# Patient Record
Sex: Female | Born: 1955 | Race: Black or African American | Hispanic: No | Marital: Married | Smoking: Never smoker
Health system: Southern US, Community
[De-identification: ages and names within clinical notes are randomized; demographics above are authoritative.]

## PROBLEM LIST (undated history)

## (undated) DIAGNOSIS — Z800 Family history of malignant neoplasm of digestive organs: Secondary | ICD-10-CM

## (undated) DIAGNOSIS — K769 Liver disease, unspecified: Secondary | ICD-10-CM

## (undated) DIAGNOSIS — E0500 Thyrotoxicosis with diffuse goiter without thyrotoxic crisis or storm: Secondary | ICD-10-CM

## (undated) DIAGNOSIS — I10 Essential (primary) hypertension: Secondary | ICD-10-CM

## (undated) DIAGNOSIS — Z86718 Personal history of other venous thrombosis and embolism: Secondary | ICD-10-CM

## (undated) DIAGNOSIS — Z5189 Encounter for other specified aftercare: Secondary | ICD-10-CM

## (undated) DIAGNOSIS — I639 Cerebral infarction, unspecified: Secondary | ICD-10-CM

## (undated) DIAGNOSIS — M1990 Unspecified osteoarthritis, unspecified site: Secondary | ICD-10-CM

## (undated) DIAGNOSIS — D649 Anemia, unspecified: Secondary | ICD-10-CM

## (undated) DIAGNOSIS — Z87442 Personal history of urinary calculi: Secondary | ICD-10-CM

## (undated) DIAGNOSIS — F32A Depression, unspecified: Secondary | ICD-10-CM

## (undated) DIAGNOSIS — K760 Fatty (change of) liver, not elsewhere classified: Secondary | ICD-10-CM

## (undated) DIAGNOSIS — I2699 Other pulmonary embolism without acute cor pulmonale: Secondary | ICD-10-CM

## (undated) DIAGNOSIS — K219 Gastro-esophageal reflux disease without esophagitis: Secondary | ICD-10-CM

## (undated) DIAGNOSIS — R7303 Prediabetes: Secondary | ICD-10-CM

## (undated) DIAGNOSIS — J189 Pneumonia, unspecified organism: Secondary | ICD-10-CM

## (undated) DIAGNOSIS — E785 Hyperlipidemia, unspecified: Secondary | ICD-10-CM

## (undated) DIAGNOSIS — T7840XA Allergy, unspecified, initial encounter: Secondary | ICD-10-CM

## (undated) DIAGNOSIS — Z8739 Personal history of other diseases of the musculoskeletal system and connective tissue: Secondary | ICD-10-CM

## (undated) DIAGNOSIS — G4730 Sleep apnea, unspecified: Secondary | ICD-10-CM

## (undated) HISTORY — DX: Depression, unspecified: F32.A

## (undated) HISTORY — DX: Essential (primary) hypertension: I10

## (undated) HISTORY — PX: OTHER SURGICAL HISTORY: SHX169

## (undated) HISTORY — DX: Personal history of other diseases of the musculoskeletal system and connective tissue: Z87.39

## (undated) HISTORY — DX: Sleep apnea, unspecified: G47.30

## (undated) HISTORY — DX: Encounter for other specified aftercare: Z51.89

## (undated) HISTORY — DX: Thyrotoxicosis with diffuse goiter without thyrotoxic crisis or storm: E05.00

## (undated) HISTORY — DX: Gastro-esophageal reflux disease without esophagitis: K21.9

## (undated) HISTORY — DX: Personal history of other venous thrombosis and embolism: Z86.718

## (undated) HISTORY — PX: ABDOMINAL HYSTERECTOMY: SUR658

## (undated) HISTORY — DX: Unspecified osteoarthritis, unspecified site: M19.90

## (undated) HISTORY — PX: CARPAL TUNNEL RELEASE: SHX101

## (undated) HISTORY — DX: Family history of malignant neoplasm of digestive organs: Z80.0

## (undated) HISTORY — PX: KNEE ARTHROSCOPY: SUR90

## (undated) HISTORY — DX: Other pulmonary embolism without acute cor pulmonale: I26.99

## (undated) HISTORY — DX: Cerebral infarction, unspecified: I63.9

## (undated) HISTORY — DX: Hyperlipidemia, unspecified: E78.5

## (undated) HISTORY — PX: TOTAL HIP ARTHROPLASTY: SHX124

## (undated) HISTORY — PX: BREAST SURGERY: SHX581

## (undated) HISTORY — PX: FOOT SURGERY: SHX648

## (undated) HISTORY — DX: Anemia, unspecified: D64.9

## (undated) HISTORY — DX: Liver disease, unspecified: K76.9

## (undated) HISTORY — PX: COLONOSCOPY: SHX174

## (undated) HISTORY — DX: Allergy, unspecified, initial encounter: T78.40XA

## (undated) MED FILL — Iron Sucrose Inj 20 MG/ML (Fe Equiv): INTRAVENOUS | Qty: 15 | Status: AC

---

## 2013-03-12 DIAGNOSIS — M25569 Pain in unspecified knee: Secondary | ICD-10-CM

## 2013-03-12 DIAGNOSIS — M545 Low back pain, unspecified: Secondary | ICD-10-CM

## 2013-03-12 HISTORY — DX: Pain in unspecified knee: M25.569

## 2013-03-12 HISTORY — DX: Low back pain, unspecified: M54.50

## 2014-08-09 HISTORY — PX: COLONOSCOPY: SHX174

## 2015-04-13 DIAGNOSIS — R55 Syncope and collapse: Secondary | ICD-10-CM

## 2015-04-13 DIAGNOSIS — I480 Paroxysmal atrial fibrillation: Secondary | ICD-10-CM

## 2015-04-13 HISTORY — DX: Syncope and collapse: R55

## 2015-04-13 HISTORY — DX: Paroxysmal atrial fibrillation: I48.0

## 2016-02-16 DIAGNOSIS — S39012A Strain of muscle, fascia and tendon of lower back, initial encounter: Secondary | ICD-10-CM | POA: Insufficient documentation

## 2016-02-16 HISTORY — DX: Strain of muscle, fascia and tendon of lower back, initial encounter: S39.012A

## 2016-05-04 DIAGNOSIS — E785 Hyperlipidemia, unspecified: Secondary | ICD-10-CM

## 2016-05-04 DIAGNOSIS — I161 Hypertensive emergency: Secondary | ICD-10-CM | POA: Diagnosis not present

## 2016-05-04 DIAGNOSIS — G459 Transient cerebral ischemic attack, unspecified: Secondary | ICD-10-CM | POA: Diagnosis not present

## 2016-05-04 DIAGNOSIS — G4733 Obstructive sleep apnea (adult) (pediatric): Secondary | ICD-10-CM | POA: Diagnosis not present

## 2016-05-04 DIAGNOSIS — D649 Anemia, unspecified: Secondary | ICD-10-CM

## 2016-05-05 DIAGNOSIS — I161 Hypertensive emergency: Secondary | ICD-10-CM | POA: Diagnosis not present

## 2016-05-05 DIAGNOSIS — G459 Transient cerebral ischemic attack, unspecified: Secondary | ICD-10-CM | POA: Diagnosis not present

## 2016-05-05 DIAGNOSIS — G4733 Obstructive sleep apnea (adult) (pediatric): Secondary | ICD-10-CM | POA: Diagnosis not present

## 2016-05-05 DIAGNOSIS — D649 Anemia, unspecified: Secondary | ICD-10-CM

## 2016-05-05 DIAGNOSIS — E785 Hyperlipidemia, unspecified: Secondary | ICD-10-CM

## 2017-06-21 HISTORY — PX: OTHER SURGICAL HISTORY: SHX169

## 2017-07-12 DIAGNOSIS — M7751 Other enthesopathy of right foot: Secondary | ICD-10-CM | POA: Insufficient documentation

## 2017-07-12 DIAGNOSIS — G8929 Other chronic pain: Secondary | ICD-10-CM | POA: Insufficient documentation

## 2017-07-12 DIAGNOSIS — I872 Venous insufficiency (chronic) (peripheral): Secondary | ICD-10-CM | POA: Insufficient documentation

## 2017-07-12 HISTORY — DX: Other enthesopathy of right foot and ankle: M77.51

## 2017-07-12 HISTORY — DX: Other chronic pain: G89.29

## 2017-07-12 HISTORY — DX: Venous insufficiency (chronic) (peripheral): I87.2

## 2017-09-08 ENCOUNTER — Encounter: Payer: Self-pay | Admitting: Gastroenterology

## 2017-11-03 DIAGNOSIS — K529 Noninfective gastroenteritis and colitis, unspecified: Secondary | ICD-10-CM

## 2017-11-03 DIAGNOSIS — E785 Hyperlipidemia, unspecified: Secondary | ICD-10-CM

## 2017-11-03 DIAGNOSIS — I10 Essential (primary) hypertension: Secondary | ICD-10-CM

## 2017-11-03 DIAGNOSIS — G4733 Obstructive sleep apnea (adult) (pediatric): Secondary | ICD-10-CM | POA: Diagnosis not present

## 2017-11-03 DIAGNOSIS — N201 Calculus of ureter: Secondary | ICD-10-CM | POA: Diagnosis not present

## 2017-11-04 DIAGNOSIS — G4733 Obstructive sleep apnea (adult) (pediatric): Secondary | ICD-10-CM | POA: Diagnosis not present

## 2017-11-04 DIAGNOSIS — E785 Hyperlipidemia, unspecified: Secondary | ICD-10-CM | POA: Diagnosis not present

## 2017-11-04 DIAGNOSIS — N201 Calculus of ureter: Secondary | ICD-10-CM | POA: Diagnosis not present

## 2017-11-04 DIAGNOSIS — I10 Essential (primary) hypertension: Secondary | ICD-10-CM | POA: Diagnosis not present

## 2017-11-04 DIAGNOSIS — K529 Noninfective gastroenteritis and colitis, unspecified: Secondary | ICD-10-CM | POA: Diagnosis not present

## 2017-11-05 DIAGNOSIS — N201 Calculus of ureter: Secondary | ICD-10-CM | POA: Diagnosis not present

## 2017-11-05 DIAGNOSIS — E785 Hyperlipidemia, unspecified: Secondary | ICD-10-CM | POA: Diagnosis not present

## 2017-11-05 DIAGNOSIS — K529 Noninfective gastroenteritis and colitis, unspecified: Secondary | ICD-10-CM | POA: Diagnosis not present

## 2017-11-05 DIAGNOSIS — I10 Essential (primary) hypertension: Secondary | ICD-10-CM | POA: Diagnosis not present

## 2017-11-15 DIAGNOSIS — Z4889 Encounter for other specified surgical aftercare: Secondary | ICD-10-CM

## 2017-11-15 HISTORY — DX: Encounter for other specified surgical aftercare: Z48.89

## 2018-04-19 ENCOUNTER — Encounter: Payer: Self-pay | Admitting: Gastroenterology

## 2018-05-02 ENCOUNTER — Other Ambulatory Visit: Payer: Self-pay

## 2018-05-02 DIAGNOSIS — I872 Venous insufficiency (chronic) (peripheral): Secondary | ICD-10-CM

## 2018-05-31 ENCOUNTER — Encounter: Payer: BLUE CROSS/BLUE SHIELD | Admitting: Gastroenterology

## 2018-06-05 DIAGNOSIS — M169 Osteoarthritis of hip, unspecified: Secondary | ICD-10-CM | POA: Insufficient documentation

## 2018-06-05 DIAGNOSIS — M1611 Unilateral primary osteoarthritis, right hip: Secondary | ICD-10-CM

## 2018-06-05 HISTORY — DX: Osteoarthritis of hip, unspecified: M16.9

## 2018-06-05 HISTORY — DX: Unilateral primary osteoarthritis, right hip: M16.11

## 2018-06-23 ENCOUNTER — Encounter

## 2018-06-23 ENCOUNTER — Encounter (HOSPITAL_COMMUNITY): Payer: BLUE CROSS/BLUE SHIELD

## 2018-06-23 ENCOUNTER — Encounter: Payer: Self-pay | Admitting: Vascular Surgery

## 2018-06-23 ENCOUNTER — Encounter: Payer: BLUE CROSS/BLUE SHIELD | Admitting: Vascular Surgery

## 2018-06-27 ENCOUNTER — Encounter: Payer: Self-pay | Admitting: Surgery

## 2018-07-12 ENCOUNTER — Encounter: Payer: Self-pay | Admitting: Family Medicine

## 2018-11-24 DIAGNOSIS — M316 Other giant cell arteritis: Secondary | ICD-10-CM | POA: Insufficient documentation

## 2018-11-24 DIAGNOSIS — E66813 Obesity, class 3: Secondary | ICD-10-CM

## 2018-11-24 HISTORY — DX: Other giant cell arteritis: M31.6

## 2018-11-24 HISTORY — DX: Morbid (severe) obesity due to excess calories: E66.01

## 2018-11-24 HISTORY — DX: Obesity, class 3: E66.813

## 2018-12-20 ENCOUNTER — Encounter: Payer: Self-pay | Admitting: Gastroenterology

## 2019-01-26 ENCOUNTER — Other Ambulatory Visit: Payer: Self-pay

## 2019-01-26 ENCOUNTER — Ambulatory Visit: Payer: BC Managed Care – PPO | Admitting: *Deleted

## 2019-01-26 VITALS — Ht 64.0 in | Wt 260.0 lb

## 2019-01-26 DIAGNOSIS — Z7952 Long term (current) use of systemic steroids: Secondary | ICD-10-CM | POA: Insufficient documentation

## 2019-01-26 DIAGNOSIS — Z800 Family history of malignant neoplasm of digestive organs: Secondary | ICD-10-CM

## 2019-01-26 DIAGNOSIS — G4489 Other headache syndrome: Secondary | ICD-10-CM | POA: Insufficient documentation

## 2019-01-26 HISTORY — DX: Long term (current) use of systemic steroids: Z79.52

## 2019-01-26 HISTORY — DX: Other headache syndrome: G44.89

## 2019-01-26 NOTE — Progress Notes (Signed)
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 Pt mailed instruction packet to included paper to complete and mail back to [facility] with addressed and stamped envelope, Emmi video, copy of consent form to read and not return, and instructions. PV completed over the phone. Pt encouraged to call with questions or issues   Pt is aware that care partner will wait in the car during procedure; if they feel like they will be too hot to wait in the car; they may wait in the lobby.  We want them to wear a mask (we do not have any that we can provide them), practice social distancing, and we will check their temperatures when they get here.  I did remind patient that their care partner needs to stay in the parking lot the entire time. Pt will wear mask into building.

## 2019-02-01 ENCOUNTER — Encounter: Payer: Self-pay | Admitting: Gastroenterology

## 2019-02-05 ENCOUNTER — Telehealth: Payer: Self-pay | Admitting: Gastroenterology

## 2019-02-05 NOTE — Telephone Encounter (Signed)
Pt would like to know whether she can continue aspirin.

## 2019-02-05 NOTE — Telephone Encounter (Signed)
Called patient, no answer. Left message on voicemail that the patient take her aspirin as directed. We do not stop this medication before colonoscopy.

## 2019-02-08 ENCOUNTER — Telehealth: Payer: Self-pay

## 2019-02-08 NOTE — Telephone Encounter (Signed)
Covid-19 screening questions   Do you now or have you had a fever in the last 14 days?  Do you have any respiratory symptoms of shortness of breath or cough now or in the last 14 days?  Do you have any family members or close contacts with diagnosed or suspected Covid-19 in the past 14 days?  Have you been tested for Covid-19 and found to be positive?    Left message to c/b   

## 2019-02-09 ENCOUNTER — Ambulatory Visit (AMBULATORY_SURGERY_CENTER): Payer: BC Managed Care – PPO | Admitting: Gastroenterology

## 2019-02-09 ENCOUNTER — Other Ambulatory Visit: Payer: Self-pay

## 2019-02-09 ENCOUNTER — Encounter: Payer: Self-pay | Admitting: Gastroenterology

## 2019-02-09 VITALS — BP 127/74 | HR 68 | Temp 96.6°F | Resp 18 | Ht 64.0 in | Wt 260.0 lb

## 2019-02-09 DIAGNOSIS — Z1211 Encounter for screening for malignant neoplasm of colon: Secondary | ICD-10-CM | POA: Diagnosis not present

## 2019-02-09 DIAGNOSIS — Z800 Family history of malignant neoplasm of digestive organs: Secondary | ICD-10-CM | POA: Diagnosis not present

## 2019-02-09 DIAGNOSIS — D123 Benign neoplasm of transverse colon: Secondary | ICD-10-CM

## 2019-02-09 MED ORDER — SODIUM CHLORIDE 0.9 % IV SOLN: 500.0000 mL | Freq: Once | INTRAVENOUS | Status: AC

## 2019-02-09 NOTE — Progress Notes (Signed)
Pt's states no medical or surgical changes since previsit or office visit.  [place]

## 2019-02-09 NOTE — Op Note (Signed)
[place] Patient Name: Stefanie Campbell Procedure Date: 02/09/2019 8:29 AM MRN: [id number] Endoscopist: Jackquline Denmark , MD Age: 63 Referring MD:  Date of Birth: 1956-04-23 Gender: Female Account #: [id number] Procedure:                Colonoscopy Indications:              FH colon cancer -dad at age 93. Medicines:                Monitored Anesthesia Care Procedure:                Pre-Anesthesia Assessment:                           - Prior to the procedure, a History and Physical                            was performed, and patient medications and                            allergies were reviewed. The patient's tolerance of                            previous anesthesia was also reviewed. The risks                            and benefits of the procedure and the sedation                            options and risks were discussed with the patient.                            All questions were answered, and informed consent                            was obtained. Prior Anticoagulants: The patient has                            taken no previous anticoagulant or antiplatelet                            agents. ASA Grade Assessment: III - A patient with                            severe systemic disease. After reviewing the risks                            and benefits, the patient was deemed in                            satisfactory condition to undergo the procedure.                           After obtaining informed consent, the colonoscope  was passed under direct vision. Throughout the                            procedure, the patient's blood pressure, pulse, and                            oxygen saturations were monitored continuously. The                            Colonoscope was introduced through the anus and                            advanced to the the cecum, identified by                            appendiceal orifice and ileocecal  valve. The                            colonoscopy was performed without difficulty. The                            patient tolerated the procedure well. The quality                            of the bowel preparation was good. The ileocecal                            valve, appendiceal orifice, and rectum were                            photographed. Scope In: 8:37:36 AM Scope Out: 8:59:31 AM Scope Withdrawal Time: 0 hours 15 minutes 13 seconds  Total Procedure Duration: 0 hours 21 minutes 55 seconds  Findings:                 A 4 mm polyp was found in the hepatic flexure. The                            polyp was sessile. The polyp was removed with a                            cold biopsy forceps. Resection and retrieval were                            complete. Estimated blood loss: none.                           A few rare small-mouthed diverticula were found in                            the sigmoid colon and ascending colon.                           Non-bleeding internal hemorrhoids were found during  retroflexion. The hemorrhoids were small.                           The exam was otherwise without abnormality. Complications:            No immediate complications. Estimated Blood Loss:     Estimated blood loss: none. Impression:               -Diminutive colonic polyp S/P polypectomy                           -Mild colonic diverticulosis.                           -Otherwise normal colonoscopy. Recommendation:           - Patient has a contact number available for                            emergencies. The signs and symptoms of potential                            delayed complications were discussed with the                            patient. Return to normal activities tomorrow.                            Written discharge instructions were provided to the                            patient.                           - Resume previous diet.                            - Continue present medications.                           - Await pathology results.                           - Repeat colonoscopy in 5 years for screening                            purposes. Earlier, if with any new problems or if                            there is any change in family history.                           - Return to GI clinic PRN. Jackquline Denmark, MD 02/09/2019 9:04:38 AM This report has been signed electronically.

## 2019-02-09 NOTE — Progress Notes (Signed)
Called to room to assist during endoscopic procedure.  Patient ID and intended procedure confirmed with present staff. Received instructions for my participation in the procedure from the performing physician.  

## 2019-02-09 NOTE — Progress Notes (Signed)
A and O x3. Report to RN. Tolerated MAC anesthesia well.

## 2019-02-09 NOTE — Patient Instructions (Signed)
YOU HAD AN ENDOSCOPIC PROCEDURE TODAY AT [facility]:   Refer to the procedure report that was given to you for any specific questions about what was found during the examination.  If the procedure report does not answer your questions, please call your gastroenterologist to clarify.  If you requested that your care partner not be given the details of your procedure findings, then the procedure report has been included in a sealed envelope for you to review at your convenience later.  YOU SHOULD EXPECT: Some feelings of bloating in the abdomen. Passage of more gas than usual.  Walking can help get rid of the air that was put into your GI tract during the procedure and reduce the bloating. If you had a lower endoscopy (such as a colonoscopy or flexible sigmoidoscopy) you may notice spotting of blood in your stool or on the toilet paper. If you underwent a bowel prep for your procedure, you may not have a normal bowel movement for a few days.  Please Note:  You might notice some irritation and congestion in your nose or some drainage.  This is from the oxygen used during your procedure.  There is no need for concern and it should clear up in a day or so.  SYMPTOMS TO REPORT IMMEDIATELY:   Following lower endoscopy (colonoscopy or flexible sigmoidoscopy):  Excessive amounts of blood in the stool  Significant tenderness or worsening of abdominal pains  Swelling of the abdomen that is new, acute  Fever of 100F or higher   For urgent or emergent issues, a gastroenterologist can be reached at any hour by calling [phone].   DIET:  We do recommend a small meal at first, but then you may proceed to your regular diet.  Drink plenty of fluids but you should avoid alcoholic beverages for 24 hours.  MEDICATIONS: Continue present medications.  Return to Dr. Steve Rattler office as needed.  Please see handouts given to you by your recovery nurse.  ACTIVITY:  You should plan to take it  easy for the rest of today and you should NOT DRIVE or use heavy machinery until tomorrow (because of the sedation medicines used during the test).    FOLLOW UP: Our staff will call the number listed on your records 48-72 hours following your procedure to check on you and address any questions or concerns that you may have regarding the information given to you following your procedure. If we do not reach you, we will leave a message.  We will attempt to reach you two times.  During this call, we will ask if you have developed any symptoms of COVID 19. If you develop any symptoms (ie: fever, flu-like symptoms, shortness of breath, cough etc.) before then, please call [phone].  If you test positive for Covid 19 in the 2 weeks post procedure, please call and report this information to [place].    If any biopsies were taken you will be contacted by phone or by letter within the next 1-3 weeks.  Please call us at [phone] if you have not heard about the biopsies in 3 weeks.   Thank you for allowing [place] to provide for your healthcare needs today.   SIGNATURES/CONFIDENTIALITY: You and/or your care partner have signed paperwork which will be entered into your electronic medical record.  These signatures attest to the fact that that the information above on your After Visit Summary has been reviewed and is understood.  Full responsibility of the confidentiality  of this discharge information lies with you and/or your care-partner.

## 2019-02-13 ENCOUNTER — Telehealth: Payer: Self-pay

## 2019-02-13 ENCOUNTER — Telehealth: Payer: Self-pay | Admitting: *Deleted

## 2019-02-13 NOTE — Telephone Encounter (Signed)
  Follow up Call-  Call back number 02/09/2019  Post procedure Call Back phone  # [phone]  Permission to leave phone message Yes  Some recent data might be hidden     Patient questions:  Do you have a fever, pain , or abdominal swelling? No. Pain Score  0 *  Have you tolerated food without any problems? Yes.    Have you been able to return to your normal activities? Yes.    Do you have any questions about your discharge instructions: Diet   No. Medications  No. Follow up visit  No.  Do you have questions or concerns about your Care? No.  Actions: * If pain score is 4 or above: No action needed, pain <4.  1. Have you developed a fever since your procedure? no  2.   Have you had an respiratory symptoms (SOB or cough) since your procedure? no  3.   Have you tested positive for COVID 19 since your procedure no  4.   Have you had any family members/close contacts diagnosed with the COVID 19 since your procedure?  no   If yes to any of these questions please route to Joylene John, RN and Alphonsa Gin, Therapist, sports.

## 2019-02-13 NOTE — Telephone Encounter (Signed)
Left message on follow up call. 

## 2019-02-18 ENCOUNTER — Encounter: Payer: Self-pay | Admitting: Gastroenterology

## 2019-03-29 ENCOUNTER — Other Ambulatory Visit: Payer: Self-pay | Admitting: Family Medicine

## 2019-03-29 DIAGNOSIS — N6320 Unspecified lump in the left breast, unspecified quadrant: Secondary | ICD-10-CM

## 2019-04-05 ENCOUNTER — Ambulatory Visit
Admission: RE | Admit: 2019-04-05 | Discharge: 2019-04-05 | Disposition: A | Discharge status: Home / Self Care | Payer: BC Managed Care – PPO | Source: Ambulatory Visit | Attending: Family Medicine | Admitting: Family Medicine

## 2019-04-05 ENCOUNTER — Other Ambulatory Visit: Payer: Self-pay

## 2019-04-05 DIAGNOSIS — N6320 Unspecified lump in the left breast, unspecified quadrant: Secondary | ICD-10-CM

## 2019-07-27 ENCOUNTER — Other Ambulatory Visit: Payer: Self-pay | Admitting: Vascular Surgery

## 2019-07-27 DIAGNOSIS — I872 Venous insufficiency (chronic) (peripheral): Secondary | ICD-10-CM

## 2019-10-05 ENCOUNTER — Encounter: Payer: Self-pay | Admitting: Cardiology

## 2019-10-05 ENCOUNTER — Ambulatory Visit: Payer: BC Managed Care – PPO | Admitting: Cardiology

## 2019-10-05 ENCOUNTER — Other Ambulatory Visit: Payer: Self-pay

## 2019-10-05 VITALS — BP 130/70 | HR 85 | Ht 62.0 in | Wt 286.0 lb

## 2019-10-05 DIAGNOSIS — R0600 Dyspnea, unspecified: Secondary | ICD-10-CM | POA: Diagnosis not present

## 2019-10-05 DIAGNOSIS — I10 Essential (primary) hypertension: Secondary | ICD-10-CM

## 2019-10-05 DIAGNOSIS — R002 Palpitations: Secondary | ICD-10-CM

## 2019-10-05 DIAGNOSIS — R079 Chest pain, unspecified: Secondary | ICD-10-CM | POA: Diagnosis not present

## 2019-10-05 DIAGNOSIS — Z8249 Family history of ischemic heart disease and other diseases of the circulatory system: Secondary | ICD-10-CM

## 2019-10-05 DIAGNOSIS — E782 Mixed hyperlipidemia: Secondary | ICD-10-CM

## 2019-10-05 LAB — BASIC METABOLIC PANEL
BUN/Creatinine Ratio: 24 (ref 12–28)
BUN: 25 mg/dL (ref 8–27)
CO2: 21 mmol/L (ref 20–29)
Calcium: 10.3 mg/dL (ref 8.7–10.3)
Chloride: 100 mmol/L (ref 96–106)
Creatinine, Ser: 1.04 mg/dL — ABNORMAL HIGH (ref 0.57–1.00)
GFR calc Af Amer: 66 mL/min/{1.73_m2} (ref >59)
GFR calc non Af Amer: 57 mL/min/{1.73_m2} — ABNORMAL LOW (ref >59)
Glucose: 130 mg/dL — ABNORMAL HIGH (ref 65–99)
Potassium: 4.2 mmol/L (ref 3.5–5.2)
Sodium: 140 mmol/L (ref 134–144)

## 2019-10-05 MED ORDER — POTASSIUM CHLORIDE CRYS ER 20 MEQ PO TBCR: 20.0000 meq | EXTENDED_RELEASE_TABLET | Freq: Every day | ORAL | 1 refills | Status: DC

## 2019-10-05 MED ORDER — FUROSEMIDE 40 MG PO TABS: 40.0000 mg | ORAL_TABLET | Freq: Every day | ORAL | 1 refills | Status: DC

## 2019-10-05 NOTE — Progress Notes (Signed)
Cardiology Office Note:    Date:  10/06/2019   ID:  Stefanie Campbell, DOB 12/04/1955, MRN [id number]  PCP:  Gala Lewandowsky, MD  Cardiologist:  Berniece Salines, DO  Electrophysiologist:  None   Referring MD: Gala Lewandowsky, MD   Chief Complaint  Patient presents with  . Chest Pain  The patient was   History of Present Illness:    Stefanie Campbell is a 64 y.o. 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  The patient tells me today that she has been experiencing intermittent chest pain, she described as a squeezing sensation the last for several minutes.  She denies associated shortness of breath.  Chest squeezing is more bothersome.  She admits to associated shortness of breath.  In addition she has been experiencing frequent intermittent palpitations.  She described as a fast heartbeat that lasts for several minutes.  Recently she told me that her PCP started her on metoprolol and this has pretty much resolved her palpitations.  No other complaints at this time.  Past Medical History:  Diagnosis Date  . Allergy   . Anemia   . Arthritis   . Blood transfusion without reported diagnosis    allergic and had to stop 15 minutes in   . GERD (gastroesophageal reflux disease)   . Graves disease   . Hyperlipidemia   . Hypertension   . Liver disease   . Sleep apnea    wears cpap   . Stroke [facility])    mini stroke per pt     Past Surgical History:  Procedure Laterality Date  . ABDOMINAL HYSTERECTOMY    . BREAST SURGERY     benign cyst  . CARPAL TUNNEL RELEASE Right   . CESAREAN SECTION     x2  . COLONOSCOPY    . FOOT SURGERY     on toe   . KNEE ARTHROSCOPY Left   . temperol biopsy    . TOTAL HIP ARTHROPLASTY Right     Current  Medications: Current Meds  Medication Sig  . albuterol (VENTOLIN HFA) 108 (90 Base) MCG/ACT inhaler albuterol sulfate HFA 90 mcg/actuation aerosol inhaler  TAKE 2 PUFFS BY MOUTH EVERY 4 TO 6 HOURS AS NEEDED  . alendronate (FOSAMAX) 70 MG tablet Fosamax 70 mg tablet  Take 1 tablet every week by oral route before meals.  Marland Kitchen amLODipine (NORVASC) 10 MG tablet amlodipine 10 mg tablet  TAKE 1 TABLET BY MOUTH EVERY DAY  . aspirin EC 81 MG tablet Take by mouth.  . Calcium Carbonate (CALCIUM 600 PO) Take by mouth.  . cetirizine (ZYRTEC) 10 MG tablet Take 10 mg by mouth daily.  . diclofenac Sodium (VOLTAREN) 1 % GEL Apply 2 g topically 4 (four) times daily.  Marland Kitchen lisinopril (ZESTRIL) 40 MG tablet Take 40 mg by mouth daily.  Marland Kitchen lovastatin (MEVACOR) 20 MG tablet lovastatin 20 mg tablet  TAKE 1 TABLET BY MOUTH EVERY DAY  . methocarbamol (ROBAXIN) 750 MG tablet Take 750 mg by mouth 3 (three) times daily.  . metoprolol succinate (TOPROL-XL) 50 MG 24 hr tablet Take 50 mg by mouth daily.  . pantoprazole (PROTONIX) 20 MG tablet Take 20 mg by mouth daily.  . predniSONE (DELTASONE) 5 MG tablet Take 15 mg by mouth daily with breakfast.  . Tocilizumab (ACTEMRA)  162 MG/0.9ML SOSY Actemra 162 mg/0.9 mL subcutaneous syringe  . Vitamin D, Ergocalciferol, (DRISDOL) 1.25 MG (50000 UT) CAPS capsule Take by mouth.  . [DISCONTINUED] cetirizine (ZYRTEC) 10 MG tablet Take 10 mg by mouth daily.   Current Facility-Administered Medications for the 10/05/19 encounter (Office Visit) with Berniece Salines, DO  Medication  . 0.9 %  sodium chloride infusion     Allergies:   Hydrocodone and Magnesium-containing compounds   Social History   Socioeconomic History  . Marital status: Married    Spouse name: Not on file  . Number of children: Not on file  . Years of education: Not on file  . Highest education level: Not on file  Occupational History  . Not on file  Tobacco Use  . Smoking status: Never Smoker  . Smokeless tobacco:  Never Used  Substance and Sexual Activity  . Alcohol use: Not Currently  . Drug use: Never  . Sexual activity: Not on file  Other Topics Concern  . Not on file  Social History Narrative  . Not on file   Social Determinants of Health   Financial Resource Strain:   . Difficulty of Paying Living Expenses:   Food Insecurity:   . Worried About Charity fundraiser in the Last Year:   . Arboriculturist in the Last Year:   Transportation Needs:   . Film/video editor (Medical):   Marland Kitchen Lack of Transportation (Non-Medical):   Physical Activity:   . Days of Exercise per Week:   . Minutes of Exercise per Session:   Stress:   . Feeling of Stress :   Social Connections:   . Frequency of Communication with Friends and Family:   . Frequency of Social Gatherings with Friends and Family:   . Attends Religious Services:   . Active Member of Clubs or Organizations:   . Attends Archivist Meetings:   Marland Kitchen Marital Status:      Family History: The patient's family history includes Breast cancer in her sister; Colon cancer in her father; Colon polyps in her sister; Heart attack in her sister and sister. There is no history of Esophageal cancer, Rectal cancer, or Stomach cancer.  ROS:   Review of Systems  Constitution: Negative for decreased appetite, fever and weight gain.  HENT: Negative for congestion, ear discharge, hoarse voice and sore throat.   Eyes: Negative for discharge, redness, vision loss in right eye and visual halos.  Cardiovascular: Reports chest pain, dyspnea on exertion and palpitations.  Negative for leg swelling, orthopnea and palpitations.  Respiratory: Negative for cough, hemoptysis, shortness of breath and snoring.   Endocrine: Negative for heat intolerance and polyphagia.  Hematologic/Lymphatic: Negative for bleeding problem. Does not bruise/bleed easily.  Skin: Negative for flushing, nail changes, rash and suspicious lesions.  Musculoskeletal: Negative for  arthritis, joint pain, muscle cramps, myalgias, neck pain and stiffness.  Gastrointestinal: Negative for abdominal pain, bowel incontinence, diarrhea and excessive appetite.  Genitourinary: Negative for decreased libido, genital sores and incomplete emptying.  Neurological: Negative for brief paralysis, focal weakness, headaches and loss of balance.  Psychiatric/Behavioral: Negative for altered mental status, depression and suicidal ideas.  Allergic/Immunologic: Negative for HIV exposure and persistent infections.    EKGs/Labs/Other Studies Reviewed:    The following studies were reviewed today:   EKG:  The ekg ordered today demonstrates sinus rhythm, heart rate 85 bpm, left axis deviation with, nonspecific intraventricular conduction defect.  Recent Labs: Lab work done on September 17, 2019  reports Chemistry: Glucose 98, BUN 15, creatinine 0.93, sodium 143, potassium 4.6, chloride 104, bicarb 19, calcium 10.9, total protein 7.1, albumin 4.6, total globulin 2.5, alkaline phosphatase 74, AST 39, ALT 73 troponin less than 0.01 CBC: WBC 10.3, hemoglobin 11.3, hematocrit 34.4, platelet 189  Recent Lipid Panel Lipid profile: Total cholesterol 202, HDL 57, triglycerides 218, LDL 111  Physical Exam:    VS:  BP 130/70 (BP Location: Left Arm, Patient Position: Sitting, Cuff Size: Large)   Pulse 85   Ht 5\' 2"  (1.575 m)   Wt 286 lb (129.7 kg)   SpO2 95%   BMI 52.31 kg/m     Wt Readings from Last 3 Encounters:  10/05/19 286 lb (129.7 kg)  09/17/19 287 lb (130.2 kg)  02/09/19 260 lb (117.9 kg)     GEN: Well nourished, well developed in no acute distress HEENT: Normal NECK: No JVD; No carotid bruits LYMPHATICS: No lymphadenopathy CARDIAC: S1S2 noted,RRR, no murmurs, rubs, gallops RESPIRATORY:  Clear to auscultation without rales, wheezing or rhonchi  ABDOMEN: Soft, non-tender, non-distended, +bowel sounds, no guarding. EXTREMITIES: t+ bilateral leg edema, No cyanosis, no  clubbing MUSCULOSKELETAL:  No deformity  SKIN: Warm and dry NEUROLOGIC:  Alert and oriented x 3, non-focal PSYCHIATRIC:  Normal affect, good insight   ASSESSMENT:    1. Dyspnea, unspecified type   2. Chest pain, unspecified type   3. Essential hypertension   4. Palpitations   5. Mixed hyperlipidemia   6. Morbid obesity ([place])   7. Family history of premature CAD    PLAN:     Her shortness of breath is multifactorial in the setting of her bilateral leg edema as well as her significant morbid obesity and deconditioning.  For now I am going to start the patient on Lasix 40 mg daily with potassium supplements.  In addition we will get a transthoracic echocardiogram to assess RV/LV function and any other structural abnormalities.  In terms of her chest pain she has multiple risk factors including significant family history of premature coronary artery disease.  I like to get a nuclear stress test for ischemic evaluation.  I have educated patient about this test and she is agreeable to proceed.  Palpitations-symptoms seem to have improved on metoprolol we will continue to monitor the patient.  Hypertension-blood pressure is acceptable in the office today.  Hyperlipidemia-continue lovastatin.  Morbid obesity-the patient understands the need to lose weight with diet and exercise. We have discussed specific strategies for this.  Blood work will performed for BMP, Mag, BNP today   The patient is in agreement with the above plan. The patient left the office in stable condition.  The patient will follow up in 3 months.   Medication Adjustments/Labs and Tests Ordered: Current medicines are reviewed at length with the patient today.  Concerns regarding medicines are outlined above.  Orders Placed This Encounter  Procedures  . Magnesium  . Pro b natriuretic peptide (BNP)  . Basic metabolic panel  . Myocardial Perfusion Imaging  . EKG 12-Lead  . ECHOCARDIOGRAM COMPLETE   Meds ordered  this encounter  Medications  . furosemide (LASIX) 40 MG tablet    Sig: Take 1 tablet (40 mg total) by mouth daily.    Dispense:  90 tablet    Refill:  1  . potassium chloride SA (KLOR-CON M20) 20 MEQ tablet    Sig: Take 1 tablet (20 mEq total) by mouth daily.    Dispense:  90 tablet    Refill:  1    Patient Instructions  Medication Instructions:   START TAKING:  1.LASIX 40 MG ONCE A DAY    2. POTASSIUM 020 MEQ ONCE DAY    *If you need a refill on your cardiac medications before your next appointment, please call your pharmacy*   Lab Work: BMET MAG AND BNP TODAY   If you have labs (blood work) drawn today and your tests are completely normal, you will receive your results only by: Marland Kitchen MyChart Message (if you have MyChart) OR . A paper copy in the mail If you have any lab test that is abnormal or we need to change your treatment, we will call you to review the results.   Testing/Procedures: Your physician has requested that you have an echocardiogram. Echocardiography is a painless test that uses sound waves to create images of your heart. It provides your doctor with information about the size and shape of your heart and how well your heart's chambers and valves are working. This procedure takes approximately one hour. There are no restrictions for this procedure.  Your physician has requested that you have a lexiscan myoview. For further information please visit [email or web address]. Please follow instruction sheet, as given.   Follow-Up: At [facility], you and your health needs are our priority.  As part of our continuing mission to provide you with exceptional heart care, we have created designated Provider Care Teams.  These Care Teams include your primary Cardiologist (physician) and Advanced Practice Providers (APPs -  Physician Assistants and Nurse Practitioners) who all work together to provide you with the care you need, when you need it.  We recommend signing up for  the patient portal called "MyChart".  Sign up information is provided on this After Visit Summary.  MyChart is used to connect with patients for Virtual Visits (Telemedicine).  Patients are able to view lab/test results, encounter notes, upcoming appointments, etc.  Non-urgent messages can be sent to your provider as well.   To learn more about what you can do with MyChart, go to [email or web address].    Your next appointment:   1 month(s)  The format for your next appointment:   In Person  Provider:   You will see  Dr. Harriet Masson.  Or, you can be scheduled with the following Advanced Practice Provider on your designated Care Team (at our [facility]):  Laurann Montana, FNP     Other Instructions      Adopting a Healthy Lifestyle.  Know what a healthy weight is for you (roughly BMI <25) and aim to maintain this   Aim for 7+ servings of fruits and vegetables daily   65-80+ fluid ounces of water or unsweet tea for healthy kidneys   Limit to max 1 drink of alcohol per day; avoid smoking/tobacco   Limit animal fats in diet for cholesterol and heart health - choose grass fed whenever available   Avoid highly processed foods, and foods high in saturated/trans fats   Aim for low stress - take time to unwind and care for your mental health   Aim for 150 min of moderate intensity exercise weekly for heart health, and weights twice weekly for bone health   Aim for 7-9 hours of sleep daily   When it comes to diets, agreement about the perfect plan isnt easy to find, even among the experts. Experts at the [employer] developed an idea known as the Healthy Eating Plate. Just imagine a plate divided into logical,  healthy portions.   The emphasis is on diet quality:   Load up on vegetables and fruits - one-half of your plate: Aim for color and variety, and remember that potatoes dont count.   Go for whole grains - one-quarter of your plate: Whole wheat,  barley, wheat berries, quinoa, oats, brown rice, and foods made with them. If you want pasta, go with whole wheat pasta.   Protein power - one-quarter of your plate: Fish, chicken, beans, and nuts are all healthy, versatile protein sources. Limit red meat.   The diet, however, does go beyond the plate, offering a few other suggestions.   Use healthy plant oils, such as olive, canola, soy, corn, sunflower and peanut. Check the labels, and avoid partially hydrogenated oil, which have unhealthy trans fats.   If youre thirsty, drink water. Coffee and tea are good in moderation, but skip sugary drinks and limit milk and dairy products to one or two daily servings.   The type of carbohydrate in the diet is more important than the amount. Some sources of carbohydrates, such as vegetables, fruits, whole grains, and beans-are healthier than others.   Finally, stay active  Signed, Berniece Salines, DO  10/06/2019 10:11 AM

## 2019-10-05 NOTE — Patient Instructions (Addendum)
Medication Instructions:   START TAKING:  1.LASIX 40 MG ONCE A DAY    2. POTASSIUM 020 MEQ ONCE DAY    *If you need a refill on your cardiac medications before your next appointment, please call your pharmacy*   Lab Work: BMET MAG AND BNP TODAY   If you have labs (blood work) drawn today and your tests are completely normal, you will receive your results only by: Marland Kitchen MyChart Message (if you have MyChart) OR . A paper copy in the mail If you have any lab test that is abnormal or we need to change your treatment, we will call you to review the results.   Testing/Procedures: Your physician has requested that you have an echocardiogram. Echocardiography is a painless test that uses sound waves to create images of your heart. It provides your doctor with information about the size and shape of your heart and how well your heart's chambers and valves are working. This procedure takes approximately one hour. There are no restrictions for this procedure.  Your physician has requested that you have a lexiscan myoview. For further information please visit [email or web address]. Please follow instruction sheet, as given.   Follow-Up: At [facility], you and your health needs are our priority.  As part of our continuing mission to provide you with exceptional heart care, we have created designated Provider Care Teams.  These Care Teams include your primary Cardiologist (physician) and Advanced Practice Providers (APPs -  Physician Assistants and Nurse Practitioners) who all work together to provide you with the care you need, when you need it.  We recommend signing up for the patient portal called "MyChart".  Sign up information is provided on this After Visit Summary.  MyChart is used to connect with patients for Virtual Visits (Telemedicine).  Patients are able to view lab/test results, encounter notes, upcoming appointments, etc.  Non-urgent messages can be sent to your provider as well.   To  learn more about what you can do with MyChart, go to [email or web address].    Your next appointment:   1 month(s)  The format for your next appointment:   In Person  Provider:   You will see  Dr. Harriet Masson.  Or, you can be scheduled with the following Advanced Practice Provider on your designated Care Team (at our [facility]):  Laurann Montana, FNP     Other Instructions

## 2019-10-06 ENCOUNTER — Encounter: Payer: Self-pay | Admitting: Cardiology

## 2019-10-06 DIAGNOSIS — R002 Palpitations: Secondary | ICD-10-CM

## 2019-10-06 DIAGNOSIS — R079 Chest pain, unspecified: Secondary | ICD-10-CM | POA: Insufficient documentation

## 2019-10-06 DIAGNOSIS — I10 Essential (primary) hypertension: Secondary | ICD-10-CM

## 2019-10-06 DIAGNOSIS — E782 Mixed hyperlipidemia: Secondary | ICD-10-CM | POA: Insufficient documentation

## 2019-10-06 HISTORY — DX: Essential (primary) hypertension: I10

## 2019-10-06 HISTORY — DX: Palpitations: R00.2

## 2019-10-06 HISTORY — DX: Chest pain, unspecified: R07.9

## 2019-10-06 HISTORY — DX: Mixed hyperlipidemia: E78.2

## 2019-10-06 LAB — PRO B NATRIURETIC PEPTIDE: NT-Pro BNP: 26 pg/mL (ref 0–287)

## 2019-10-06 LAB — MAGNESIUM: Magnesium: 1.8 mg/dL (ref 1.6–2.3)

## 2019-10-08 ENCOUNTER — Telehealth: Payer: Self-pay

## 2019-10-08 NOTE — Telephone Encounter (Signed)
-----   Message from Berniece Salines, DO sent at 10/08/2019  8:20 AM EDT ----- Creatinine slightly elevated, compared to labs from PCP. We will continue to monitor.

## 2019-10-08 NOTE — Telephone Encounter (Signed)
Spoke with patient regarding results and recommendation.  Patient verbalizes understanding and is agreeable to plan of care. Advised patient to call back with any issues or concerns.  

## 2019-10-17 ENCOUNTER — Telehealth (HOSPITAL_COMMUNITY): Payer: Self-pay | Admitting: *Deleted

## 2019-10-17 NOTE — Telephone Encounter (Signed)
Left message on voicemail per DPR in reference to upcoming appointment scheduled on 10/24/19 at 11:30 with detailed instructions given per Myocardial Perfusion Study Information Sheet for the test. LM to arrive 15 minutes early, and that it is imperative to arrive on time for appointment to keep from having the test rescheduled. If you need to cancel or reschedule your appointment, please call the office within 24 hours of your appointment. Failure to do so may result in a cancellation of your appointment, and a $50 no show fee. Phone number given for call back for any questions.

## 2019-10-24 ENCOUNTER — Other Ambulatory Visit: Payer: Self-pay

## 2019-10-24 ENCOUNTER — Other Ambulatory Visit: Payer: BC Managed Care – PPO

## 2019-10-24 ENCOUNTER — Ambulatory Visit: Payer: BC Managed Care – PPO

## 2019-10-25 ENCOUNTER — Ambulatory Visit (INDEPENDENT_AMBULATORY_CARE_PROVIDER_SITE_OTHER): Payer: BC Managed Care – PPO

## 2019-10-25 ENCOUNTER — Ambulatory Visit: Payer: BC Managed Care – PPO

## 2019-10-25 DIAGNOSIS — R079 Chest pain, unspecified: Secondary | ICD-10-CM | POA: Diagnosis not present

## 2019-10-26 ENCOUNTER — Other Ambulatory Visit: Payer: Self-pay

## 2019-10-26 ENCOUNTER — Ambulatory Visit (HOSPITAL_COMMUNITY): Payer: BC Managed Care – PPO | Attending: Internal Medicine

## 2019-10-26 ENCOUNTER — Telehealth: Payer: Self-pay

## 2019-10-26 VITALS — Ht 62.0 in | Wt 286.0 lb

## 2019-10-26 DIAGNOSIS — R0600 Dyspnea, unspecified: Secondary | ICD-10-CM | POA: Insufficient documentation

## 2019-10-26 DIAGNOSIS — R079 Chest pain, unspecified: Secondary | ICD-10-CM | POA: Insufficient documentation

## 2019-10-26 DIAGNOSIS — E782 Mixed hyperlipidemia: Secondary | ICD-10-CM | POA: Insufficient documentation

## 2019-10-26 MED ORDER — TECHNETIUM TC 99M TETROFOSMIN IV KIT
31.7000 | PACK | Freq: Once | INTRAVENOUS | Status: AC | PRN
  Administered 2019-10-26: 31.7 via INTRAVENOUS
  Filled 2019-10-26: qty 32

## 2019-10-26 MED ORDER — REGADENOSON 0.4 MG/5ML IV SOLN
0.4000 mg | Freq: Once | INTRAVENOUS | Status: AC
  Administered 2019-10-26: 0.4 mg via INTRAVENOUS

## 2019-10-26 NOTE — Telephone Encounter (Signed)
-----   Message from Berniece Salines, DO sent at 10/26/2019  2:09 PM EDT ----- The echo showed that the heart is not fully relaxing like it should ( diastolic dysfunction) , unfortunately we cannot see all the valves well but there are no regurgitation/leaking valves noted.  I will discuss it at the next office visit.

## 2019-10-26 NOTE — Telephone Encounter (Signed)
Tried calling patient. No answer and voicemail is full so I can not leave any messages at this time.  

## 2019-10-29 ENCOUNTER — Ambulatory Visit (HOSPITAL_COMMUNITY): Payer: BC Managed Care – PPO | Attending: Cardiovascular Disease

## 2019-10-29 ENCOUNTER — Other Ambulatory Visit: Payer: Self-pay

## 2019-10-29 MED ORDER — TECHNETIUM TC 99M TETROFOSMIN IV KIT
30.4000 | PACK | Freq: Once | INTRAVENOUS | Status: AC | PRN
  Administered 2019-10-29: 30.4 via INTRAVENOUS
  Filled 2019-10-29: qty 31

## 2019-10-29 NOTE — Telephone Encounter (Signed)
Spoke with patient regarding results and recommendation.  Patient verbalizes understanding and is agreeable to plan of care. Advised patient to call back with any issues or concerns.  

## 2019-10-30 LAB — MYOCARDIAL PERFUSION IMAGING
LV dias vol: 115 mL (ref 46–106)
LV sys vol: 50 mL
Peak HR: 111 {beats}/min
Rest HR: 83 {beats}/min
SDS: 4
SRS: 1
SSS: 5
TID: 0.97

## 2019-11-02 ENCOUNTER — Telehealth: Payer: Self-pay

## 2019-11-02 NOTE — Telephone Encounter (Signed)
-----   Message from Berniece Salines, DO sent at 11/02/2019  8:40 AM EDT ----- Normal stress test..

## 2019-11-02 NOTE — Telephone Encounter (Signed)
Spoke with patient regarding results.  Patient verbalizes understanding and is agreeable to plan of care. Advised patient to call back with any issues or concerns.  

## 2019-11-15 ENCOUNTER — Ambulatory Visit: Payer: BC Managed Care – PPO | Admitting: Cardiology

## 2019-11-15 ENCOUNTER — Other Ambulatory Visit: Payer: Self-pay

## 2019-11-15 ENCOUNTER — Encounter: Payer: Self-pay | Admitting: Cardiology

## 2019-11-15 VITALS — BP 138/78 | HR 92 | Ht 62.0 in | Wt 291.8 lb

## 2019-11-15 DIAGNOSIS — R600 Localized edema: Secondary | ICD-10-CM

## 2019-11-15 DIAGNOSIS — I10 Essential (primary) hypertension: Secondary | ICD-10-CM

## 2019-11-15 DIAGNOSIS — E782 Mixed hyperlipidemia: Secondary | ICD-10-CM | POA: Diagnosis not present

## 2019-11-15 HISTORY — DX: Localized edema: R60.0

## 2019-11-15 NOTE — Progress Notes (Signed)
Cardiology Office Note:    Date:  11/15/2019   ID:  Stefanie Campbell, DOB 21-Nov-1955, MRN [id number]  PCP:  Gala Lewandowsky, MD  Cardiologist:  Berniece Salines, DO  Electrophysiologist:  None   Referring MD: Gala Lewandowsky, MD   Chief Complaint  Patient presents with  . Follow-up    1 MO FU    History of Present Illness:    Stefanie Campbell is a 64 y.o. 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  I last saw the patient on 10/05/2019 at that time for her shortness of breath I recommended she undergo a pharmacologic stress test and an echocardiogram.   In the interim the patient underwent her testing. The results were previously called to the patient. Today she is her for a follow visit. She tells me that the she has had some resolution of her symptoms. No other complaints at this time.   Past Medical History:  Diagnosis Date  . Allergy   . Anemia   . Arthritis   . Blood transfusion without reported diagnosis    allergic and had to stop 15 minutes in   . GERD (gastroesophageal reflux disease)   . Graves disease   . Hyperlipidemia   . Hypertension   . Liver disease   . Sleep apnea    wears cpap   . Stroke [facility])    mini stroke per pt     Past Surgical History:  Procedure Laterality Date  . ABDOMINAL HYSTERECTOMY    . BREAST SURGERY     benign cyst  . CARPAL TUNNEL RELEASE Right   . CESAREAN SECTION     x2  . COLONOSCOPY    . FOOT SURGERY     on toe   . KNEE ARTHROSCOPY Left   . temperol biopsy    . TOTAL HIP ARTHROPLASTY Right     Current Medications: Current Meds  Medication Sig  . albuterol (VENTOLIN HFA) 108 (90 Base) MCG/ACT inhaler albuterol sulfate HFA 90 mcg/actuation aerosol inhaler  TAKE 2 PUFFS BY MOUTH EVERY 4 TO 6 HOURS AS NEEDED  . alendronate (FOSAMAX) 70 MG tablet Fosamax 70 mg tablet  Take 1  tablet every week by oral route before meals.  Marland Kitchen amLODipine (NORVASC) 10 MG tablet amlodipine 10 mg tablet  TAKE 1 TABLET BY MOUTH EVERY DAY  . aspirin EC 81 MG tablet Take 81 mg by mouth daily.   . Calcium Carbonate (CALCIUM 600 PO) Take 1 tablet by mouth daily.   . cetirizine (ZYRTEC) 10 MG tablet Take 10 mg by mouth as needed.   . diclofenac Sodium (VOLTAREN) 1 % GEL Apply 2 g topically 4 (four) times daily.  . Fluocinolone Acetonide Scalp 0.01 % OIL 2 (two) times a week.   . furosemide (LASIX) 40 MG tablet Take 1 tablet (40 mg total) by mouth daily.  Marland Kitchen ketoconazole (NIZORAL) 2 % shampoo Apply 1 application topically once a week.   Marland Kitchen lisinopril (ZESTRIL) 40 MG tablet Take 40 mg by mouth daily.  Marland Kitchen lovastatin (MEVACOR) 20 MG tablet lovastatin 20 mg tablet  TAKE 1 TABLET BY MOUTH EVERY DAY  . metoprolol succinate (TOPROL-XL) 50 MG 24 hr tablet Take 50 mg by mouth daily.  . pantoprazole (PROTONIX) 20 MG tablet Take 20 mg by mouth daily.  . potassium chloride SA (KLOR-CON M20) 20 MEQ tablet Take 1 tablet (20 mEq  total) by mouth daily.  . predniSONE (DELTASONE) 10 MG tablet Take 10 mg by mouth daily.   . SYMBICORT 160-4.5 MCG/ACT inhaler Inhale 2 puffs into the lungs 2 (two) times daily.   . Tocilizumab (ACTEMRA) 162 MG/0.9ML SOSY once a week.   . Vitamin D, Ergocalciferol, (DRISDOL) 1.25 MG (50000 UT) CAPS capsule Take 50,000 Units by mouth daily.    Current Facility-Administered Medications for the 11/15/19 encounter (Office Visit) with Berniece Salines, DO  Medication  . 0.9 %  sodium chloride infusion     Allergies:   Hydrocodone and Magnesium-containing compounds   Social History   Socioeconomic History  . Marital status: Married    Spouse name: Not on file  . Number of children: Not on file  . Years of education: Not on file  . Highest education level: Not on file  Occupational History  . Not on file  Tobacco Use  . Smoking status: Never Smoker  . Smokeless tobacco: Never Used   Substance and Sexual Activity  . Alcohol use: Not Currently  . Drug use: Never  . Sexual activity: Not on file  Other Topics Concern  . Not on file  Social History Narrative  . Not on file   Social Determinants of Health   Financial Resource Strain:   . Difficulty of Paying Living Expenses:   Food Insecurity:   . Worried About Charity fundraiser in the Last Year:   . Arboriculturist in the Last Year:   Transportation Needs:   . Film/video editor (Medical):   Marland Kitchen Lack of Transportation (Non-Medical):   Physical Activity:   . Days of Exercise per Week:   . Minutes of Exercise per Session:   Stress:   . Feeling of Stress :   Social Connections:   . Frequency of Communication with Friends and Family:   . Frequency of Social Gatherings with Friends and Family:   . Attends Religious Services:   . Active Member of Clubs or Organizations:   . Attends Archivist Meetings:   Marland Kitchen Marital Status:      Family History: The patient's family history includes Breast cancer in her sister; Colon cancer in her father; Colon polyps in her sister; Heart attack in her sister and sister. There is no history of Esophageal cancer, Rectal cancer, or Stomach cancer.  ROS:   Review of Systems  Constitution: Negative for decreased appetite, fever and weight gain.  HENT: Negative for congestion, ear discharge, hoarse voice and sore throat.   Eyes: Negative for discharge, redness, vision loss in right eye and visual halos.  Cardiovascular: Negative for chest pain, dyspnea on exertion, leg swelling, orthopnea and palpitations.  Respiratory: Negative for cough, hemoptysis, shortness of breath and snoring.   Endocrine: Negative for heat intolerance and polyphagia.  Hematologic/Lymphatic: Negative for bleeding problem. Does not bruise/bleed easily.  Skin: Negative for flushing, nail changes, rash and suspicious lesions.  Musculoskeletal: Negative for arthritis, joint pain, muscle cramps,  myalgias, neck pain and stiffness.  Gastrointestinal: Negative for abdominal pain, bowel incontinence, diarrhea and excessive appetite.  Genitourinary: Negative for decreased libido, genital sores and incomplete emptying.  Neurological: Negative for brief paralysis, focal weakness, headaches and loss of balance.  Psychiatric/Behavioral: Negative for altered mental status, depression and suicidal ideas.  Allergic/Immunologic: Negative for HIV exposure and persistent infections.    EKGs/Labs/Other Studies Reviewed:    The following studies were reviewed today:   EKG:  None today   TTE Impression  10/25/2019 1. Left ventricular ejection fraction, by estimation, is 50 to 55%. The  left ventricle has low normal function on limited apical views only. Left ventricular endocardial border not optimally defined to evaluate regional wall motion. Left ventricular  diastolic parameters are consistent with Grade I diastolic dysfunction (impaired relaxation).  2. Right ventricular systolic function was not well visualized. The right ventricular size is not well visualized.  3. The mitral valve was not well visualized. No evidence of mitral valve regurgitation.  4. The aortic valve was not well visualized. Aortic valve regurgitation is not visualized. No aortic stenosis is present.  5. The inferior vena cava is normal in size with greater than 50% respiratory variability, suggesting right atrial pressure of 3 mmHg.   Pharmacologic stress test 10/25/2019  Nuclear stress EF: 57%. The left ventricular ejection fraction is normal (55-65%).  There was no ST segment deviation noted during stress.  Defect 1: There is a small defect of moderate severity present in the mid inferior, apical inferior and apex location. This is most c/w breast and diaphragmatic attenuation. The apex and apical inferior walls contract normally .  This is a low risk study. No evidence of ischemia or previous infarction  The  study is normal.  Recent Labs: 10/05/2019: BUN 25; Creatinine, Ser 1.04; Magnesium 1.8; NT-Pro BNP 26; Potassium 4.2; Sodium 140  Recent Lipid Panel No results found for: CHOL, TRIG, HDL, CHOLHDL, VLDL, LDLCALC, LDLDIRECT  Physical Exam:    VS:  BP 138/78   Pulse 92   Ht 5\' 2"  (1.575 m)   Wt 291 lb 12.8 oz (132.4 kg)   SpO2 93%   BMI 53.37 kg/m     Wt Readings from Last 3 Encounters:  11/15/19 291 lb 12.8 oz (132.4 kg)  10/26/19 286 lb (129.7 kg)  10/24/19 286 lb (129.7 kg)     GEN: Well nourished, well developed in no acute distress HEENT: Normal NECK: No JVD; No carotid bruits LYMPHATICS: No lymphadenopathy CARDIAC: S1S2 noted,RRR, no murmurs, rubs, gallops RESPIRATORY:  Clear to auscultation without rales, wheezing or rhonchi  ABDOMEN: Soft, non-tender, non-distended, +bowel sounds, no guarding. EXTREMITIES: bilateral leg edema, No cyanosis, no clubbing MUSCULOSKELETAL:  No deformity  SKIN: Warm and dry NEUROLOGIC:  Alert and oriented x 3, non-focal PSYCHIATRIC:  Normal affect, good insight  ASSESSMENT:    1. Essential hypertension   2. Bilateral leg edema   3. Mixed hyperlipidemia    PLAN:    1. Bilateral leg edema - I will increase her lasix to 40 mg twice daily for 1 week and then go back to Lasix 40mg  daily with potassium supplement. Bmp and mag will be done today.   2. I discuss with patient again about her testing. All of her questions were answered.   3. Her chest pain and shortness is resolved.   4. Hypertension her blood pressure is acceptable at this time.  The patient is in agreement with the above plan. The patient left the office in stable condition.  The patient will follow up in 6 months or sooner if needed.   Medication Adjustments/Labs and Tests Ordered: Current medicines are reviewed at length with the patient today.  Concerns regarding medicines are outlined above.  No orders of the defined types were placed in this encounter.  No orders  of the defined types were placed in this encounter.   There are no Patient Instructions on file for this visit.   Adopting a Healthy Lifestyle.  Know what a  healthy weight is for you (roughly BMI <25) and aim to maintain this   Aim for 7+ servings of fruits and vegetables daily   65-80+ fluid ounces of water or unsweet tea for healthy kidneys   Limit to max 1 drink of alcohol per day; avoid smoking/tobacco   Limit animal fats in diet for cholesterol and heart health - choose grass fed whenever available   Avoid highly processed foods, and foods high in saturated/trans fats   Aim for low stress - take time to unwind and care for your mental health   Aim for 150 min of moderate intensity exercise weekly for heart health, and weights twice weekly for bone health   Aim for 7-9 hours of sleep daily   When it comes to diets, agreement about the perfect plan isnt easy to find, even among the experts. Experts at the [employer] developed an idea known as the Healthy Eating Plate. Just imagine a plate divided into logical, healthy portions.   The emphasis is on diet quality:   Load up on vegetables and fruits - one-half of your plate: Aim for color and variety, and remember that potatoes dont count.   Go for whole grains - one-quarter of your plate: Whole wheat, barley, wheat berries, quinoa, oats, brown rice, and foods made with them. If you want pasta, go with whole wheat pasta.   Protein power - one-quarter of your plate: Fish, chicken, beans, and nuts are all healthy, versatile protein sources. Limit red meat.   The diet, however, does go beyond the plate, offering a few other suggestions.   Use healthy plant oils, such as olive, canola, soy, corn, sunflower and peanut. Check the labels, and avoid partially hydrogenated oil, which have unhealthy trans fats.   If youre thirsty, drink water. Coffee and tea are good in moderation, but skip sugary drinks and  limit milk and dairy products to one or two daily servings.   The type of carbohydrate in the diet is more important than the amount. Some sources of carbohydrates, such as vegetables, fruits, whole grains, and beans-are healthier than others.   Finally, stay active  Signed, Berniece Salines, DO  11/15/2019 9:17 PM    [facility]

## 2019-11-16 ENCOUNTER — Encounter: Payer: Self-pay | Admitting: Cardiology

## 2019-11-16 NOTE — Progress Notes (Signed)
Sent request through epic for PCP to send blood work results to DR. Tobb

## 2019-11-16 NOTE — Patient Instructions (Signed)
Medication Instructions:  1. Increase Lasix to 40 mg two times daily for 1 week 2. Then go back to Lasix 40 mg once daily with a potassium supplement  *If you need a refill on your cardiac medications before your next appointment, please call your pharmacy*   Lab Work: Your physician recommends that you have a BMP and Magnesium level, we will check with Dr. Spero Curb for labs done in her office  If you have labs (blood work) drawn today and your tests are completely normal, you will receive your results only by: Marland Kitchen MyChart Message (if you have MyChart) OR . A paper copy in the mail If you have any lab test that is abnormal or we need to change your treatment, we will call you to review the results.   Testing/Procedures: None   Follow-Up: At [facility], you and your health needs are our priority.  As part of our continuing mission to provide you with exceptional heart care, we have created designated Provider Care Teams.  These Care Teams include your primary Cardiologist (physician) and Advanced Practice Providers (APPs -  Physician Assistants and Nurse Practitioners) who all work together to provide you with the care you need, when you need it.  We recommend signing up for the patient portal called "MyChart".  Sign up information is provided on this After Visit Summary.  MyChart is used to connect with patients for Virtual Visits (Telemedicine).  Patients are able to view lab/test results, encounter notes, upcoming appointments, etc.  Non-urgent messages can be sent to your provider as well.   To learn more about what you can do with MyChart, go to [email or web address].    Your next appointment:  Follow up with DR. Tobb in 6 months, you will get a letter in the mail letting you know it is time to make that appointment.

## 2020-01-24 DIAGNOSIS — G4730 Sleep apnea, unspecified: Secondary | ICD-10-CM | POA: Insufficient documentation

## 2020-01-24 DIAGNOSIS — K769 Liver disease, unspecified: Secondary | ICD-10-CM | POA: Insufficient documentation

## 2020-01-24 DIAGNOSIS — Z5189 Encounter for other specified aftercare: Secondary | ICD-10-CM | POA: Insufficient documentation

## 2020-01-24 DIAGNOSIS — M1990 Unspecified osteoarthritis, unspecified site: Secondary | ICD-10-CM | POA: Insufficient documentation

## 2020-01-24 DIAGNOSIS — I639 Cerebral infarction, unspecified: Secondary | ICD-10-CM | POA: Insufficient documentation

## 2020-01-24 DIAGNOSIS — E785 Hyperlipidemia, unspecified: Secondary | ICD-10-CM | POA: Insufficient documentation

## 2020-01-24 DIAGNOSIS — E0500 Thyrotoxicosis with diffuse goiter without thyrotoxic crisis or storm: Secondary | ICD-10-CM | POA: Insufficient documentation

## 2020-01-24 DIAGNOSIS — D649 Anemia, unspecified: Secondary | ICD-10-CM | POA: Insufficient documentation

## 2020-01-24 DIAGNOSIS — K219 Gastro-esophageal reflux disease without esophagitis: Secondary | ICD-10-CM | POA: Insufficient documentation

## 2020-01-24 DIAGNOSIS — T7840XA Allergy, unspecified, initial encounter: Secondary | ICD-10-CM | POA: Insufficient documentation

## 2020-01-24 DIAGNOSIS — I10 Essential (primary) hypertension: Secondary | ICD-10-CM | POA: Insufficient documentation

## 2020-01-25 ENCOUNTER — Encounter: Payer: Self-pay | Admitting: Cardiology

## 2020-01-25 ENCOUNTER — Other Ambulatory Visit: Payer: Self-pay

## 2020-01-25 ENCOUNTER — Ambulatory Visit: Payer: BC Managed Care – PPO | Admitting: Cardiology

## 2020-01-25 VITALS — BP 142/62 | HR 84 | Ht 64.0 in | Wt 290.0 lb

## 2020-01-25 DIAGNOSIS — I5032 Chronic diastolic (congestive) heart failure: Secondary | ICD-10-CM | POA: Diagnosis not present

## 2020-01-25 DIAGNOSIS — E1100 Type 2 diabetes mellitus with hyperosmolarity without nonketotic hyperglycemic-hyperosmolar coma (NKHHC): Secondary | ICD-10-CM

## 2020-01-25 DIAGNOSIS — G4733 Obstructive sleep apnea (adult) (pediatric): Secondary | ICD-10-CM | POA: Diagnosis not present

## 2020-01-25 DIAGNOSIS — I10 Essential (primary) hypertension: Secondary | ICD-10-CM

## 2020-01-25 MED ORDER — FUROSEMIDE 40 MG PO TABS: 40.0000 mg | ORAL_TABLET | Freq: Every day | ORAL | 3 refills | Status: DC

## 2020-01-25 MED ORDER — AMLODIPINE BESYLATE 5 MG PO TABS: 2.5000 mg | ORAL_TABLET | Freq: Every day | ORAL | 0 refills | Status: DC

## 2020-01-25 NOTE — Progress Notes (Signed)
Cardiology Office Note:    Date:  01/25/2020   ID:  Stefanie Campbell, DOB August 27, 1955, MRN [id number]  PCP:  Gala Lewandowsky, MD  Cardiologist:  Berniece Salines, DO  Electrophysiologist:  None   Referring MD: Gala Lewandowsky, MD   I had some changes in her medications and the leg have been swelling recently   History of Present Illness:    Stefanie Campbell a 64 y.o.femalewith a hx of hypertension, hyperlipidemia, giant cell arthritis on prednisone, significant family history of coronary artery disease,obesity, sleep apnea on cpap.  At her last visit on May 01/08/2020 I increased her Lasix to 40 mg twice daily for a week and she was to go back on 2 mg daily.  In addition we discussed her testing results.  She tells me in the meantime she has had some changes with her medication by her PCP where due to her systolic blood pressure being in the 100s her amlodipine was stopped as well as her Lasix dosage was changed.  Transiently she was seen in emergency department at which time she was asked to restart her Lasix to 2 mg in the morning and 20 mg in the afternoon.  She tells me the last couple days she has had her blood pressure continued to go up and his systolics in the 798X at home.  Leg swelling has improved but still is not ideal.  No other complaints at this time   Past Medical History:  Diagnosis Date  . Aftercare following surgery 11/15/2017  . Allergy   . Anemia   . Arthritis   . Bilateral leg edema 11/15/2019  . Blood transfusion without reported diagnosis    allergic and had to stop 15 minutes in   . Chest pain 10/06/2019  . Chronic pain of right ankle 07/12/2017  . Chronic venous insufficiency 07/12/2017  . Class 3 severe obesity in adult ([place]) 11/24/2018  . Current use of steroid medication 01/26/2019  . Essential hypertension 10/06/2019  . GERD (gastroesophageal reflux disease)   . Giant cell arteritis ([place]) 11/24/2018  . Graves disease   .  Hyperlipidemia   . Hypertension   . Liver disease   . Low back pain 03/12/2013  . Low back strain 02/16/2016  . Mixed hyperlipidemia 10/06/2019  . Osteoarthritis of hip 06/05/2018  . Other headache syndrome 01/26/2019  . PAF (paroxysmal atrial fibrillation) ([place]) 04/13/2015   Formatting of this note might be different from the original. CHADS2 vasc score= 2 Formatting of this note might be different from the original. Overview:  CHADS2 vasc score= 2  . Pain in unspecified knee 03/12/2013  . Palpitations 10/06/2019  . Primary osteoarthritis of one hip, right 06/05/2018  . Sleep apnea    wears cpap   . Stroke [facility])    mini stroke per pt   . Syncope 04/13/2015  . Tendonitis of ankle, right 07/12/2017    Past Surgical History:  Procedure Laterality Date  . ABDOMINAL HYSTERECTOMY    . BREAST SURGERY     benign cyst  . CARPAL TUNNEL RELEASE Right   . CESAREAN SECTION     x2  . COLONOSCOPY    . FOOT SURGERY     on toe   . KNEE ARTHROSCOPY Left   . temperol biopsy    . TOTAL HIP ARTHROPLASTY Right     Current Medications: Current Meds  Medication Sig  . albuterol (VENTOLIN HFA) 108 (90 Base) MCG/ACT inhaler albuterol sulfate HFA 90 mcg/actuation aerosol  inhaler  TAKE 2 PUFFS BY MOUTH EVERY 4 TO 6 HOURS AS NEEDED  . alendronate (FOSAMAX) 70 MG tablet Fosamax 70 mg tablet  Take 1 tablet every week by oral route before meals.  Marland Kitchen aspirin EC 81 MG tablet Take 81 mg by mouth daily.   . Calcium Carbonate (CALCIUM 600 PO) Take 1 tablet by mouth daily.   . cetirizine (ZYRTEC) 10 MG tablet Take 10 mg by mouth as needed.   . diclofenac Sodium (VOLTAREN) 1 % GEL Apply 2 g topically 4 (four) times daily.  . finasteride (PROSCAR) 5 MG tablet Take 5 mg by mouth daily.   . Fluocinolone Acetonide Scalp 0.01 % OIL 2 (two) times a week.   . furosemide (LASIX) 40 MG tablet Take 80 mg by mouth daily.  Marland Kitchen ketoconazole (NIZORAL) 2 % shampoo Apply 1 application topically once a week.   . lovastatin  (MEVACOR) 40 MG tablet Take 40 mg by mouth daily.  . metFORMIN (GLUCOPHAGE) 500 MG tablet Take 500 mg by mouth daily.  . methocarbamol (ROBAXIN) 750 MG tablet Take 750 mg by mouth daily as needed.   . metoprolol succinate (TOPROL-XL) 50 MG 24 hr tablet Take 50 mg by mouth daily.  . pantoprazole (PROTONIX) 20 MG tablet Take 20 mg by mouth daily.  . predniSONE (DELTASONE) 5 MG tablet Take 5 mg by mouth daily.  . SYMBICORT 160-4.5 MCG/ACT inhaler Inhale 2 puffs into the lungs 2 (two) times daily.   . Tocilizumab (ACTEMRA) 162 MG/0.9ML SOSY once a week.   . Vitamin D, Ergocalciferol, (DRISDOL) 1.25 MG (50000 UT) CAPS capsule Take 50,000 Units by mouth daily.    Current Facility-Administered Medications for the 01/25/20 encounter (Office Visit) with Berniece Salines, DO  Medication  . 0.9 %  sodium chloride infusion     Allergies:   Hydrocodone and Magnesium-containing compounds   Social History   Socioeconomic History  . Marital status: Married    Spouse name: Not on file  . Number of children: Not on file  . Years of education: Not on file  . Highest education level: Not on file  Occupational History  . Not on file  Tobacco Use  . Smoking status: Never Smoker  . Smokeless tobacco: Never Used  Substance and Sexual Activity  . Alcohol use: Not Currently  . Drug use: Never  . Sexual activity: Not on file  Other Topics Concern  . Not on file  Social History Narrative  . Not on file   Social Determinants of Health   Financial Resource Strain:   . Difficulty of Paying Living Expenses:   Food Insecurity:   . Worried About Charity fundraiser in the Last Year:   . Arboriculturist in the Last Year:   Transportation Needs:   . Film/video editor (Medical):   Marland Kitchen Lack of Transportation (Non-Medical):   Physical Activity:   . Days of Exercise per Week:   . Minutes of Exercise per Session:   Stress:   . Feeling of Stress :   Social Connections:   . Frequency of Communication with  Friends and Family:   . Frequency of Social Gatherings with Friends and Family:   . Attends Religious Services:   . Active Member of Clubs or Organizations:   . Attends Archivist Meetings:   Marland Kitchen Marital Status:      Family History: The patient's family history includes Breast cancer in her sister; Colon cancer in her father; Colon  polyps in her sister; Heart attack in her sister and sister. There is no history of Esophageal cancer, Rectal cancer, or Stomach cancer.  ROS:   Review of Systems  Constitution: Negative for decreased appetite, fever and weight gain.  HENT: Negative for congestion, ear discharge, hoarse voice and sore throat.   Eyes: Negative for discharge, redness, vision loss in right eye and visual halos.  Cardiovascular: Negative for chest pain, dyspnea on exertion, leg swelling, orthopnea and palpitations.  Respiratory: Negative for cough, hemoptysis, shortness of breath and snoring.   Endocrine: Negative for heat intolerance and polyphagia.  Hematologic/Lymphatic: Negative for bleeding problem. Does not bruise/bleed easily.  Skin: Negative for flushing, nail changes, rash and suspicious lesions.  Musculoskeletal: Negative for arthritis, joint pain, muscle cramps, myalgias, neck pain and stiffness.  Gastrointestinal: Negative for abdominal pain, bowel incontinence, diarrhea and excessive appetite.  Genitourinary: Negative for decreased libido, genital sores and incomplete emptying.  Neurological: Negative for brief paralysis, focal weakness, headaches and loss of balance.  Psychiatric/Behavioral: Negative for altered mental status, depression and suicidal ideas.  Allergic/Immunologic: Negative for HIV exposure and persistent infections.    EKGs/Labs/Other Studies Reviewed:    The following studies were reviewed today:   EKG: None today  TTE Impression 10/25/2019 1. Left ventricular ejection fraction, by estimation, is 50 to 55%. The  left ventricle has low  normal function on limited apical views only. Left ventricular endocardial border not optimally defined to evaluate regional wall motion. Left ventricular  diastolic parameters are consistent with Grade I diastolic dysfunction (impaired relaxation).  2. Right ventricular systolic function was not well visualized. The right ventricular size is not well visualized.  3. The mitral valve was not well visualized. No evidence of mitral valve regurgitation.  4. The aortic valve was not well visualized. Aortic valve regurgitation is not visualized. No aortic stenosis is present.  5. The inferior vena cava is normal in size with greater than 50% respiratory variability, suggesting right atrial pressure of 3 mmHg.   Pharmacologic stress test 10/25/2019  Nuclear stress EF: 57%. The left ventricular ejection fraction is normal (55-65%).  There was no ST segment deviation noted during stress.  Defect 1: There is a small defect of moderate severity present in the mid inferior, apical inferior and apex location. This is most c/w breast and diaphragmatic attenuation. The apex and apical inferior walls contract normally .  This is a low risk study. No evidence of ischemia or previous infarction  The study is normal.  Recent Labs: 10/05/2019: BUN 25; Creatinine, Ser 1.04; Magnesium 1.8; NT-Pro BNP 26; Potassium 4.2; Sodium 140  Recent Lipid Panel No results found for: CHOL, TRIG, HDL, CHOLHDL, VLDL, LDLCALC, LDLDIRECT  Physical Exam:    VS:  BP (!) 142/62   Pulse 84   Ht 5\' 4"  (1.626 m)   Wt 290 lb (131.5 kg)   SpO2 93%   BMI 49.78 kg/m     Wt Readings from Last 3 Encounters:  01/25/20 290 lb (131.5 kg)  11/15/19 291 lb 12.8 oz (132.4 kg)  10/26/19 286 lb (129.7 kg)     GEN: Well nourished, well developed in no acute distress HEENT: Normal NECK: No JVD; No carotid bruits LYMPHATICS: No lymphadenopathy CARDIAC: S1S2 noted,RRR, no murmurs, rubs, gallops RESPIRATORY:  Clear to auscultation  without rales, wheezing or rhonchi  ABDOMEN: Soft, non-tender, non-distended, +bowel sounds, no guarding. EXTREMITIES: No edema, No cyanosis, no clubbing MUSCULOSKELETAL:  No deformity  SKIN: Warm and dry NEUROLOGIC:  Alert  and oriented x 3, non-focal PSYCHIATRIC:  Normal affect, good insight  ASSESSMENT:    1. Essential hypertension   2. Obstructive sleep apnea syndrome   3. Morbid obesity ([place])   4. Chronic diastolic heart failure ([place])   5. Type 2 diabetes mellitus with hyperosmolarity without coma, without long-term current use of insulin (HCC)    PLAN:    Bilateral leg edema-she notes that the Lasix 40 mg in the morning and 20 mg at night is helping.  Were going to refill the patient medication as Lasix 40 mg twice daily.  Therefore she will take 40 mg in the morning, and half a pill at night.  I have instructed the patient to take an additional 20 mg whenever she gains more than 3 pounds in 1 day and more than 5 pounds in 1 week.  He expresses understanding.  Blood work will be done today for BMP.  In terms of her hypertension I will restart amlodipine at 2.5 mg daily.  If this does not help keep her below target.  Will transition her from metoprolol to carvedilol.  Morbid obesity-the patient understands the need to lose weight with diet and exercise. We have discussed specific strategies for this.  Diabetes mellitus-continue patient on current medication PCP.  The patient is in agreement with the above plan. The patient left the office in stable condition.  The patient will follow up in 1 month or sooner if needed.   Medication Adjustments/Labs and Tests Ordered: Current medicines are reviewed at length with the patient today.  Concerns regarding medicines are outlined above.  Orders Placed This Encounter  Procedures  . Basic metabolic panel  . Magnesium   Meds ordered this encounter  Medications  . furosemide (LASIX) 40 MG tablet    Sig: Take 1 tablet (40 mg total) by  mouth daily.    Dispense:  90 tablet    Refill:  3  . amLODipine (NORVASC) 5 MG tablet    Sig: Take 0.5 tablets (2.5 mg total) by mouth daily.    Dispense:  45 tablet    Refill:  0    Patient Instructions  Medication Instructions:  Your physician has recommended you make the following change in your medication:   Take Lasix 40 mg twice daily. Take Amlodipine 2.5 mg daily.  If you gain more than 3 pounds in a day or more than 5 pounds in a week take an extra 20 mg of Lasix.   *If you need a refill on your cardiac medications before your next appointment, please call your pharmacy*   Lab Work: Your physician recommends that you have a BMET and Magnesium level done today in the office.  If you have labs (blood work) drawn today and your tests are completely normal, you will receive your results only by: Marland Kitchen MyChart Message (if you have MyChart) OR . A paper copy in the mail If you have any lab test that is abnormal or we need to change your treatment, we will call you to review the results.   Testing/Procedures: None ordered   Follow-Up: At [facility], you and your health needs are our priority.  As part of our continuing mission to provide you with exceptional heart care, we have created designated Provider Care Teams.  These Care Teams include your primary Cardiologist (physician) and Advanced Practice Providers (APPs -  Physician Assistants and Nurse Practitioners) who all work together to provide you with the care you need, when you need it.  We recommend signing up for the patient portal called "MyChart".  Sign up information is provided on this After Visit Summary.  MyChart is used to connect with patients for Virtual Visits (Telemedicine).  Patients are able to view lab/test results, encounter notes, upcoming appointments, etc.  Non-urgent messages can be sent to your provider as well.   To learn more about what you can do with MyChart, go to [email or web address].     Your next appointment:   1 month(s)  The format for your next appointment:   In Person  Provider:   Jyl Heinz, MD   Other Instructions NA     Adopting a Healthy Lifestyle.  Know what a healthy weight is for you (roughly BMI <25) and aim to maintain this   Aim for 7+ servings of fruits and vegetables daily   65-80+ fluid ounces of water or unsweet tea for healthy kidneys   Limit to max 1 drink of alcohol per day; avoid smoking/tobacco   Limit animal fats in diet for cholesterol and heart health - choose grass fed whenever available   Avoid highly processed foods, and foods high in saturated/trans fats   Aim for low stress - take time to unwind and care for your mental health   Aim for 150 min of moderate intensity exercise weekly for heart health, and weights twice weekly for bone health   Aim for 7-9 hours of sleep daily   When it comes to diets, agreement about the perfect plan isnt easy to find, even among the experts. Experts at the [employer] developed an idea known as the Healthy Eating Plate. Just imagine a plate divided into logical, healthy portions.   The emphasis is on diet quality:   Load up on vegetables and fruits - one-half of your plate: Aim for color and variety, and remember that potatoes dont count.   Go for whole grains - one-quarter of your plate: Whole wheat, barley, wheat berries, quinoa, oats, brown rice, and foods made with them. If you want pasta, go with whole wheat pasta.   Protein power - one-quarter of your plate: Fish, chicken, beans, and nuts are all healthy, versatile protein sources. Limit red meat.   The diet, however, does go beyond the plate, offering a few other suggestions.   Use healthy plant oils, such as olive, canola, soy, corn, sunflower and peanut. Check the labels, and avoid partially hydrogenated oil, which have unhealthy trans fats.   If youre thirsty, drink water. Coffee and tea are good  in moderation, but skip sugary drinks and limit milk and dairy products to one or two daily servings.   The type of carbohydrate in the diet is more important than the amount. Some sources of carbohydrates, such as vegetables, fruits, whole grains, and beans-are healthier than others.   Finally, stay active  Signed, Berniece Salines, DO  01/25/2020 10:53 AM    [place]

## 2020-01-25 NOTE — Patient Instructions (Signed)
Medication Instructions:  Your physician has recommended you make the following change in your medication:   Take Lasix 40 mg twice daily. Take Amlodipine 2.5 mg daily.  If you gain more than 3 pounds in a day or more than 5 pounds in a week take an extra 20 mg of Lasix.   *If you need a refill on your cardiac medications before your next appointment, please call your pharmacy*   Lab Work: Your physician recommends that you have a BMET and Magnesium level done today in the office.  If you have labs (blood work) drawn today and your tests are completely normal, you will receive your results only by: Marland Kitchen MyChart Message (if you have MyChart) OR . A paper copy in the mail If you have any lab test that is abnormal or we need to change your treatment, we will call you to review the results.   Testing/Procedures: None ordered   Follow-Up: At [facility], you and your health needs are our priority.  As part of our continuing mission to provide you with exceptional heart care, we have created designated Provider Care Teams.  These Care Teams include your primary Cardiologist (physician) and Advanced Practice Providers (APPs -  Physician Assistants and Nurse Practitioners) who all work together to provide you with the care you need, when you need it.  We recommend signing up for the patient portal called "MyChart".  Sign up information is provided on this After Visit Summary.  MyChart is used to connect with patients for Virtual Visits (Telemedicine).  Patients are able to view lab/test results, encounter notes, upcoming appointments, etc.  Non-urgent messages can be sent to your provider as well.   To learn more about what you can do with MyChart, go to [email or web address].    Your next appointment:   1 month(s)  The format for your next appointment:   In Person  Provider:   Jyl Heinz, MD   Other Instructions NA

## 2020-02-22 ENCOUNTER — Other Ambulatory Visit: Payer: Self-pay

## 2020-02-22 DIAGNOSIS — I872 Venous insufficiency (chronic) (peripheral): Secondary | ICD-10-CM

## 2020-02-26 ENCOUNTER — Other Ambulatory Visit: Payer: Self-pay

## 2020-02-26 ENCOUNTER — Other Ambulatory Visit: Payer: Self-pay | Admitting: Cardiology

## 2020-02-26 ENCOUNTER — Ambulatory Visit: Payer: BC Managed Care – PPO | Admitting: Cardiology

## 2020-02-26 ENCOUNTER — Encounter: Payer: Self-pay | Admitting: Cardiology

## 2020-02-26 VITALS — BP 150/90 | HR 80 | Ht 64.0 in | Wt 289.0 lb

## 2020-02-26 DIAGNOSIS — I10 Essential (primary) hypertension: Secondary | ICD-10-CM | POA: Diagnosis not present

## 2020-02-26 DIAGNOSIS — I480 Paroxysmal atrial fibrillation: Secondary | ICD-10-CM

## 2020-02-26 DIAGNOSIS — E782 Mixed hyperlipidemia: Secondary | ICD-10-CM | POA: Diagnosis not present

## 2020-02-26 DIAGNOSIS — G4733 Obstructive sleep apnea (adult) (pediatric): Secondary | ICD-10-CM

## 2020-02-26 DIAGNOSIS — I5032 Chronic diastolic (congestive) heart failure: Secondary | ICD-10-CM | POA: Diagnosis not present

## 2020-02-26 DIAGNOSIS — R600 Localized edema: Secondary | ICD-10-CM

## 2020-02-26 HISTORY — DX: Morbid (severe) obesity due to excess calories: E66.01

## 2020-02-26 MED ORDER — FUROSEMIDE 40 MG PO TABS: 40.0000 mg | ORAL_TABLET | Freq: Two times a day (BID) | ORAL | 3 refills | Status: DC

## 2020-02-26 MED ORDER — METOLAZONE 5 MG PO TABS: 5.0000 mg | ORAL_TABLET | Freq: Every day | ORAL | 0 refills | Status: DC

## 2020-02-26 NOTE — Patient Instructions (Signed)
Medication Instructions:  Your physician has recommended you make the following change in your medication:  INCREASE: Furosemide 40 mg take one tablet by mouth twice daily.  START: Metolazone 5 mg take one tablet by mouth 30 minutes prior to your furosemide in the morning. Take this daily for one week.  *If you need a refill on your cardiac medications before your next appointment, please call your pharmacy*   Lab Work: Your physician recommends that you return for lab work in: TODAY BMP, ProBNP, [place] If you have labs (blood work) drawn today and your tests are completely normal, you will receive your results only by: Marland Kitchen MyChart Message (if you have MyChart) OR . A paper copy in the mail If you have any lab test that is abnormal or we need to change your treatment, we will call you to review the results.   Testing/Procedures: None   Follow-Up: At [facility], you and your health needs are our priority.  As part of our continuing mission to provide you with exceptional heart care, we have created designated Provider Care Teams.  These Care Teams include your primary Cardiologist (physician) and Advanced Practice Providers (APPs -  Physician Assistants and Nurse Practitioners) who all work together to provide you with the care you need, when you need it.  We recommend signing up for the patient portal called "MyChart".  Sign up information is provided on this After Visit Summary.  MyChart is used to connect with patients for Virtual Visits (Telemedicine).  Patients are able to view lab/test results, encounter notes, upcoming appointments, etc.  Non-urgent messages can be sent to your provider as well.   To learn more about what you can do with MyChart, go to [email or web address].    Your next appointment:   2 week(s)  The format for your next appointment:   In Person  Provider:   Berniece Salines, DO   Other Instructions

## 2020-02-26 NOTE — Progress Notes (Signed)
289lb

## 2020-02-26 NOTE — Progress Notes (Addendum)
Cardiology Office Note:    Date:  02/26/2020   ID:  Stefanie Campbell, DOB March 20, 1956, MRN [id number]  PCP:  Gala Lewandowsky, MD  Cardiologist:  Berniece Salines, DO  Electrophysiologist:  None   Referring MD: Gala Lewandowsky, MD   Chief Complaint  Patient presents with  . Follow-up    History of Present Illness:    Stefanie Campbell a 64 y.o.femalewith a hx of hypertension, hyperlipidemia, giant cell arthritis on prednisone, significant family history of coronary artery disease,obesity, sleep apneaon cpap, diagnosis of paroxysmal atrial fibrillation many years ago.  She has had some improvement but still has significant leg edema. No chest pain or shortness of breath. She is more bothered by her neuropathy.   Past Medical History:  Diagnosis Date  . Aftercare following surgery 11/15/2017  . Allergy   . Anemia   . Arthritis   . Bilateral leg edema 11/15/2019  . Blood transfusion without reported diagnosis    allergic and had to stop 15 minutes in   . Chest pain 10/06/2019  . Chronic pain of right ankle 07/12/2017  . Chronic venous insufficiency 07/12/2017  . Class 3 severe obesity in adult ([place]) 11/24/2018  . Current use of steroid medication 01/26/2019  . Essential hypertension 10/06/2019  . GERD (gastroesophageal reflux disease)   . Giant cell arteritis ([place]) 11/24/2018  . Graves disease   . Hyperlipidemia   . Hypertension   . Liver disease   . Low back pain 03/12/2013  . Low back strain 02/16/2016  . Mixed hyperlipidemia 10/06/2019  . Osteoarthritis of hip 06/05/2018  . Other headache syndrome 01/26/2019  . PAF (paroxysmal atrial fibrillation) ([place]) 04/13/2015   Formatting of this note might be different from the original. CHADS2 vasc score= 2 Formatting of this note might be different from the original. Overview:  CHADS2 vasc score= 2  . Pain in unspecified knee 03/12/2013  . Palpitations 10/06/2019  . Primary osteoarthritis of one hip, right  06/05/2018  . Sleep apnea    wears cpap   . Stroke [facility])    mini stroke per pt   . Syncope 04/13/2015  . Tendonitis of ankle, right 07/12/2017    Past Surgical History:  Procedure Laterality Date  . ABDOMINAL HYSTERECTOMY    . BREAST SURGERY     benign cyst  . CARPAL TUNNEL RELEASE Right   . CESAREAN SECTION     x2  . COLONOSCOPY    . FOOT SURGERY     on toe   . KNEE ARTHROSCOPY Left   . temperol biopsy    . TOTAL HIP ARTHROPLASTY Right     Current Medications: Current Meds  Medication Sig  . albuterol (VENTOLIN HFA) 108 (90 Base) MCG/ACT inhaler albuterol sulfate HFA 90 mcg/actuation aerosol inhaler  TAKE 2 PUFFS BY MOUTH EVERY 4 TO 6 HOURS AS NEEDED  . alendronate (FOSAMAX) 70 MG tablet Fosamax 70 mg tablet  Take 1 tablet every week by oral route before meals.  Marland Kitchen amLODipine (NORVASC) 5 MG tablet Take 0.5 tablets (2.5 mg total) by mouth daily.  Marland Kitchen aspirin EC 81 MG tablet Take 81 mg by mouth daily.   . Calcium Carbonate (CALCIUM 600 PO) Take 1 tablet by mouth daily.   . cetirizine (ZYRTEC) 10 MG tablet Take 10 mg by mouth as needed.   . diclofenac Sodium (VOLTAREN) 1 % GEL Apply 2 g topically 4 (four) times daily.  . finasteride (PROSCAR) 5 MG tablet Take 5 mg by mouth  daily.   . Fluocinolone Acetonide Scalp 0.01 % OIL 2 (two) times a week.   Marland Kitchen ketoconazole (NIZORAL) 2 % shampoo Apply 1 application topically once a week.   . lovastatin (MEVACOR) 40 MG tablet Take 40 mg by mouth daily.  . metFORMIN (GLUCOPHAGE) 500 MG tablet Take 500 mg by mouth daily.  . methocarbamol (ROBAXIN) 750 MG tablet Take 750 mg by mouth daily as needed.   . metoprolol succinate (TOPROL-XL) 50 MG 24 hr tablet Take 50 mg by mouth daily.  . pantoprazole (PROTONIX) 20 MG tablet Take 20 mg by mouth daily.  . predniSONE (DELTASONE) 5 MG tablet Take 5 mg by mouth daily.  . pregabalin (LYRICA) 75 MG capsule Take 75 mg by mouth 2 (two) times daily.  . SYMBICORT 160-4.5 MCG/ACT inhaler Inhale 2 puffs into  the lungs 2 (two) times daily.   . Tocilizumab (ACTEMRA) 162 MG/0.9ML SOSY once a week.   . Vitamin D, Ergocalciferol, (DRISDOL) 1.25 MG (50000 UT) CAPS capsule Take 50,000 Units by mouth daily.   . [DISCONTINUED] furosemide (LASIX) 20 MG tablet Take 20 mg by mouth every evening.  . [DISCONTINUED] furosemide (LASIX) 40 MG tablet Take 40 mg by mouth in the morning.   Current Facility-Administered Medications for the 02/26/20 encounter (Office Visit) with Berniece Salines, DO  Medication  . 0.9 %  sodium chloride infusion     Allergies:   Hydrocodone and Magnesium-containing compounds   Social History   Socioeconomic History  . Marital status: Married    Spouse name: Not on file  . Number of children: Not on file  . Years of education: Not on file  . Highest education level: Not on file  Occupational History  . Not on file  Tobacco Use  . Smoking status: Never Smoker  . Smokeless tobacco: Never Used  Substance and Sexual Activity  . Alcohol use: Not Currently  . Drug use: Never  . Sexual activity: Not on file  Other Topics Concern  . Not on file  Social History Narrative  . Not on file   Social Determinants of Health   Financial Resource Strain:   . Difficulty of Paying Living Expenses: Not on file  Food Insecurity:   . Worried About Charity fundraiser in the Last Year: Not on file  . Ran Out of Food in the Last Year: Not on file  Transportation Needs:   . Lack of Transportation (Medical): Not on file  . Lack of Transportation (Non-Medical): Not on file  Physical Activity:   . Days of Exercise per Week: Not on file  . Minutes of Exercise per Session: Not on file  Stress:   . Feeling of Stress : Not on file  Social Connections:   . Frequency of Communication with Friends and Family: Not on file  . Frequency of Social Gatherings with Friends and Family: Not on file  . Attends Religious Services: Not on file  . Active Member of Clubs or Organizations: Not on file  .  Attends Archivist Meetings: Not on file  . Marital Status: Not on file     Family History: The patient's family history includes Breast cancer in her sister; Colon cancer in her father; Colon polyps in her sister; Heart attack in her sister and sister. There is no history of Esophageal cancer, Rectal cancer, or Stomach cancer.  ROS:   Review of Systems  Constitution: Negative for decreased appetite, fever and weight gain.  HENT: Negative for congestion,  ear discharge, hoarse voice and sore throat.   Eyes: Negative for discharge, redness, vision loss in right eye and visual halos.  Cardiovascular: Negative for chest pain, dyspnea on exertion, leg swelling, orthopnea and palpitations.  Respiratory: Negative for cough, hemoptysis, shortness of breath and snoring.   Endocrine: Negative for heat intolerance and polyphagia.  Hematologic/Lymphatic: Negative for bleeding problem. Does not bruise/bleed easily.  Skin: Negative for flushing, nail changes, rash and suspicious lesions.  Musculoskeletal: Negative for arthritis, joint pain, muscle cramps, myalgias, neck pain and stiffness.  Gastrointestinal: Negative for abdominal pain, bowel incontinence, diarrhea and excessive appetite.  Genitourinary: Negative for decreased libido, genital sores and incomplete emptying.  Neurological: Negative for brief paralysis, focal weakness, headaches and loss of balance.  Psychiatric/Behavioral: Negative for altered mental status, depression and suicidal ideas.  Allergic/Immunologic: Negative for HIV exposure and persistent infections.    EKGs/Labs/Other Studies Reviewed:    The following studies were reviewed today:   EKG:  None today   TTE Impression 10/25/2019 1. Left ventricular ejection fraction, by estimation, is 50 to 55%. The  left ventricle has low normal function on limited apical views only. Left ventricular endocardial border not optimally defined to evaluate regional wall motion.  Left ventricular  diastolic parameters are consistent with Grade I diastolic dysfunction (impaired relaxation).  2. Right ventricular systolic function was not well visualized. The right ventricular size is not well visualized.  3. The mitral valve was not well visualized. No evidence of mitral valve regurgitation.  4. The aortic valve was not well visualized. Aortic valve regurgitation is not visualized. No aortic stenosis is present.  5. The inferior vena cava is normal in size with greater than 50% respiratory variability, suggesting right atrial pressure of 3 mmHg.  Pharmacologic stress test 10/25/2019  Nuclear stress EF: 57%. The left ventricular ejection fraction is normal (55-65%).  There was no ST segment deviation noted during stress.  Defect 1: There is a small defect of moderate severity present in the mid inferior, apical inferior and apex location. This is most c/w breast and diaphragmatic attenuation. The apex and apical inferior walls contract normally .  This is a low risk study. No evidence of ischemia or previous infarction The study is normal.  Recent Labs: 10/05/2019: BUN 25; Creatinine, Ser 1.04; Magnesium 1.8; NT-Pro BNP 26; Potassium 4.2; Sodium 140  Recent Lipid Panel No results found for: CHOL, TRIG, HDL, CHOLHDL, VLDL, LDLCALC, LDLDIRECT  Physical Exam:    VS:  BP (!) 150/90 (BP Location: Right Arm, Patient Position: Sitting, Cuff Size: Large)   Pulse 80   Ht 5\' 4"  (1.626 m)   Wt 289 lb (131.1 kg)   SpO2 95%   BMI 49.61 kg/m     Wt Readings from Last 3 Encounters:  02/26/20 289 lb (131.1 kg)  01/25/20 290 lb (131.5 kg)  11/15/19 291 lb 12.8 oz (132.4 kg)     GEN: Well nourished, well developed in no acute distress HEENT: Normal NECK: No JVD; No carotid bruits LYMPHATICS: No lymphadenopathy CARDIAC: S1S2 noted,RRR, no murmurs, rubs, gallops RESPIRATORY:  Clear to auscultation without rales, wheezing or rhonchi  ABDOMEN: Soft, non-tender,  non-distended, +bowel sounds, no guarding. EXTREMITIES: Bilateral +2 edema, No cyanosis, no clubbing MUSCULOSKELETAL:  No deformity  SKIN: Warm and dry NEUROLOGIC:  Alert and oriented x 3, non-focal PSYCHIATRIC:  Normal affect, good insight  ASSESSMENT:    1. Essential hypertension   2. Chronic diastolic heart failure ([place])   3. PAF (paroxysmal atrial fibrillation) (  [place])   4. Mixed hyperlipidemia   5. Obstructive sleep apnea syndrome   6. Morbid obesity ([place])   7. Bilateral leg edema    PLAN:    She still does have some bilateral leg edema though this has improved from increasing her Lasix to 40 mg twice a day. She will take Zaroxolyn 5 mg 30 minutes prior to her initial Lasix dose for 7 days for effective diuresis. Blood work be done today which will include BMP as well as BNP and magnesium. If she does not respond to this increasing diuretics as well as addition with Zaroxolyn the patient will need inpatient IV diuretics.  I did discuss with the patient about her previous diagnosis of atrial fibrillation. She tells me that she has not had any recurrent episode and has been many many many years ago. I will continue to monitor the patient as we have to have further discussion for need of anticoagulation given her hypertension, her sex, her history of stroke is also questionable.  OSA continue CPAP.  Hypertension her blood pressure is slightly elevated in the office today. Plan will be effective diuresis and increasing her diuretics will help with this.  The patient understands the need to lose weight with diet and exercise. We have discussed specific strategies for this.  Hyperlipidemia-continue lovastatin.  The patient is in agreement with the above plan. The patient left the office in stable condition.  The patient will follow up in 2 weeks.   Medication Adjustments/Labs and Tests Ordered: Current medicines are reviewed at length with the patient today.  Concerns regarding  medicines are outlined above.  Orders Placed This Encounter  Procedures  . Basic metabolic panel  . Magnesium  . Pro b natriuretic peptide (BNP)   Meds ordered this encounter  Medications  . furosemide (LASIX) 40 MG tablet    Sig: Take 1 tablet (40 mg total) by mouth 2 (two) times daily.    Dispense:  180 tablet    Refill:  3  . metolazone (ZAROXOLYN) 5 MG tablet    Sig: Take 1 tablet (5 mg total) by mouth daily. Take 30 minutes prior to your furosemide in the morning.    Dispense:  7 tablet    Refill:  0    Patient Instructions  Medication Instructions:  Your physician has recommended you make the following change in your medication:  INCREASE: Furosemide 40 mg take one tablet by mouth twice daily.  START: Metolazone 5 mg take one tablet by mouth 30 minutes prior to your furosemide in the morning. Take this daily for one week.  *If you need a refill on your cardiac medications before your next appointment, please call your pharmacy*   Lab Work: Your physician recommends that you return for lab work in: TODAY BMP, ProBNP, [place] If you have labs (blood work) drawn today and your tests are completely normal, you will receive your results only by: Marland Kitchen MyChart Message (if you have MyChart) OR . A paper copy in the mail If you have any lab test that is abnormal or we need to change your treatment, we will call you to review the results.   Testing/Procedures: None   Follow-Up: At [facility], you and your health needs are our priority.  As part of our continuing mission to provide you with exceptional heart care, we have created designated Provider Care Teams.  These Care Teams include your primary Cardiologist (physician) and Advanced Practice Providers (APPs -  Physician Assistants and Nurse  Practitioners) who all work together to provide you with the care you need, when you need it.  We recommend signing up for the patient portal called "MyChart".  Sign up information is  provided on this After Visit Summary.  MyChart is used to connect with patients for Virtual Visits (Telemedicine).  Patients are able to view lab/test results, encounter notes, upcoming appointments, etc.  Non-urgent messages can be sent to your provider as well.   To learn more about what you can do with MyChart, go to [email or web address].    Your next appointment:   2 week(s)  The format for your next appointment:   In Person  Provider:   Berniece Salines, DO   Other Instructions      Adopting a Healthy Lifestyle.  Know what a healthy weight is for you (roughly BMI <25) and aim to maintain this   Aim for 7+ servings of fruits and vegetables daily   65-80+ fluid ounces of water or unsweet tea for healthy kidneys   Limit to max 1 drink of alcohol per day; avoid smoking/tobacco   Limit animal fats in diet for cholesterol and heart health - choose grass fed whenever available   Avoid highly processed foods, and foods high in saturated/trans fats   Aim for low stress - take time to unwind and care for your mental health   Aim for 150 min of moderate intensity exercise weekly for heart health, and weights twice weekly for bone health   Aim for 7-9 hours of sleep daily   When it comes to diets, agreement about the perfect plan isnt easy to find, even among the experts. Experts at the [employer] developed an idea known as the Healthy Eating Plate. Just imagine a plate divided into logical, healthy portions.   The emphasis is on diet quality:   Load up on vegetables and fruits - one-half of your plate: Aim for color and variety, and remember that potatoes dont count.   Go for whole grains - one-quarter of your plate: Whole wheat, barley, wheat berries, quinoa, oats, brown rice, and foods made with them. If you want pasta, go with whole wheat pasta.   Protein power - one-quarter of your plate: Fish, chicken, beans, and nuts are all healthy, versatile  protein sources. Limit red meat.   The diet, however, does go beyond the plate, offering a few other suggestions.   Use healthy plant oils, such as olive, canola, soy, corn, sunflower and peanut. Check the labels, and avoid partially hydrogenated oil, which have unhealthy trans fats.   If youre thirsty, drink water. Coffee and tea are good in moderation, but skip sugary drinks and limit milk and dairy products to one or two daily servings.   The type of carbohydrate in the diet is more important than the amount. Some sources of carbohydrates, such as vegetables, fruits, whole grains, and beans-are healthier than others.   Finally, stay active  Signed, Berniece Salines, DO  02/26/2020 1:31 PM    [facility]

## 2020-02-27 LAB — BASIC METABOLIC PANEL
BUN/Creatinine Ratio: 17 (ref 12–28)
BUN: 19 mg/dL (ref 8–27)
CO2: 24 mmol/L (ref 20–29)
Calcium: 10.2 mg/dL (ref 8.7–10.3)
Chloride: 104 mmol/L (ref 96–106)
Creatinine, Ser: 1.1 mg/dL — ABNORMAL HIGH (ref 0.57–1.00)
GFR calc Af Amer: 61 mL/min/{1.73_m2} (ref >59)
GFR calc non Af Amer: 53 mL/min/{1.73_m2} — ABNORMAL LOW (ref >59)
Glucose: 107 mg/dL — ABNORMAL HIGH (ref 65–99)
Potassium: 4.4 mmol/L (ref 3.5–5.2)
Sodium: 143 mmol/L (ref 134–144)

## 2020-02-27 LAB — PRO B NATRIURETIC PEPTIDE: NT-Pro BNP: 102 pg/mL (ref 0–287)

## 2020-02-27 LAB — MAGNESIUM: Magnesium: 1.7 mg/dL (ref 1.6–2.3)

## 2020-03-04 ENCOUNTER — Telehealth: Payer: Self-pay

## 2020-03-04 NOTE — Telephone Encounter (Signed)
-----   Message from Berniece Salines, DO sent at 03/04/2020 10:50 AM EDT -----  Doristine Devoid news, creatinine stable.  Continue current Lasix dosing

## 2020-03-04 NOTE — Telephone Encounter (Signed)
Spoke with patient regarding results and recommendation.  Patient verbalizes understanding and is agreeable to plan of care. Advised patient to call back with any issues or concerns.  

## 2020-03-06 ENCOUNTER — Ambulatory Visit: Payer: BC Managed Care – PPO | Admitting: Physician Assistant

## 2020-03-06 ENCOUNTER — Other Ambulatory Visit: Payer: Self-pay

## 2020-03-06 ENCOUNTER — Ambulatory Visit (HOSPITAL_COMMUNITY)
Admission: RE | Admit: 2020-03-06 | Discharge: 2020-03-06 | Disposition: A | Discharge status: Home / Self Care | Payer: BC Managed Care – PPO | Source: Ambulatory Visit | Attending: Vascular Surgery | Admitting: Vascular Surgery

## 2020-03-06 VITALS — BP 135/84 | HR 92 | Temp 97.6°F | Resp 14 | Ht 64.0 in | Wt 279.0 lb

## 2020-03-06 DIAGNOSIS — I8393 Asymptomatic varicose veins of bilateral lower extremities: Secondary | ICD-10-CM

## 2020-03-06 DIAGNOSIS — I872 Venous insufficiency (chronic) (peripheral): Secondary | ICD-10-CM | POA: Diagnosis not present

## 2020-03-06 DIAGNOSIS — M7989 Other specified soft tissue disorders: Secondary | ICD-10-CM | POA: Diagnosis not present

## 2020-03-06 NOTE — Progress Notes (Signed)
VASCULAR [facility] VEIN SPECIALISTS           OF [place]  History and Physical   Stefanie Campbell is a 64 y.o. female who presents with BLE swelling.  She is followed by [facility].  She has hx of significant leg edema that was improved at the last visit on 02/26/2020 after increasing her lasix dose.  Zaroxolyn was also added.   She states that her swelling has worsened over the past year. Her legs started darkening.  They are painful and the left is worse than the right.  She does get cramping in her legs when she walks, but does not happen all the time.  She does not have any non healing wounds.  She does not have hx of DVT.  She does not have any family hx of varicose veins to her knowledge.  She has tried knee high compression in the past, but she did have swelling above the elastic and she stopped wearing them.  She states that her swelling is a bit better in the morning.  She has hx of two pregnancies that she delivered via caesarean section.  We did talk about weight loss and she states that ever since she started taking the prednisone, she has gained weight and had a hard time loosing any.  She is on this for giant cell arteritis.  She states she had a bx that was negative.  Her sx returned and she was on Prednisone and given that, another bx was not done.  Her sx did improve with prednisone.   She has hx of PAF.  She is not on AC at this time except asa 81mg .   Pt is accompanied by her sister.  They both work with [facility] services as needed.    The pt is on a statin for cholesterol management.  The pt is on a daily aspirin.   Other AC:  none The pt is on CCB, BB for hypertension.   The pt is diabetic.   Tobacco hx:  never  No family hx of AAA.  Past Medical History:  Diagnosis Date  . Aftercare following surgery 11/15/2017  . Allergy   . Anemia   . Arthritis   . Bilateral leg edema 11/15/2019  . Blood transfusion without reported diagnosis    allergic  and had to stop 15 minutes in   . Chest pain 10/06/2019  . Chronic pain of right ankle 07/12/2017  . Chronic venous insufficiency 07/12/2017  . Class 3 severe obesity in adult ([place]) 11/24/2018  . Current use of steroid medication 01/26/2019  . Essential hypertension 10/06/2019  . GERD (gastroesophageal reflux disease)   . Giant cell arteritis ([place]) 11/24/2018  . Graves disease   . Hyperlipidemia   . Hypertension   . Liver disease   . Low back pain 03/12/2013  . Low back strain 02/16/2016  . Mixed hyperlipidemia 10/06/2019  . Osteoarthritis of hip 06/05/2018  . Other headache syndrome 01/26/2019  . PAF (paroxysmal atrial fibrillation) ([place]) 04/13/2015   Formatting of this note might be different from the original. CHADS2 vasc score= 2 Formatting of this note might be different from the original. Overview:  CHADS2 vasc score= 2  . Pain in unspecified knee 03/12/2013  . Palpitations 10/06/2019  . Primary osteoarthritis of one hip, right 06/05/2018  . Sleep apnea    wears cpap   . Stroke [facility])    mini stroke per  pt   . Syncope 04/13/2015  . Tendonitis of ankle, right 07/12/2017    Past Surgical History:  Procedure Laterality Date  . ABDOMINAL HYSTERECTOMY    . BREAST SURGERY     benign cyst  . CARPAL TUNNEL RELEASE Right   . CESAREAN SECTION     x2  . COLONOSCOPY    . FOOT SURGERY     on toe   . KNEE ARTHROSCOPY Left   . temperol biopsy    . TOTAL HIP ARTHROPLASTY Right     Social History   Socioeconomic History  . Marital status: Married    Spouse name: Not on file  . Number of children: Not on file  . Years of education: Not on file  . Highest education level: Not on file  Occupational History  . Not on file  Tobacco Use  . Smoking status: Never Smoker  . Smokeless tobacco: Never Used  Substance and Sexual Activity  . Alcohol use: Not Currently  . Drug use: Never  . Sexual activity: Not on file  Other Topics Concern  . Not on file  Social History Narrative  . Not on  file   Social Determinants of Health   Financial Resource Strain:   . Difficulty of Paying Living Expenses: Not on file  Food Insecurity:   . Worried About Charity fundraiser in the Last Year: Not on file  . Ran Out of Food in the Last Year: Not on file  Transportation Needs:   . Lack of Transportation (Medical): Not on file  . Lack of Transportation (Non-Medical): Not on file  Physical Activity:   . Days of Exercise per Week: Not on file  . Minutes of Exercise per Session: Not on file  Stress:   . Feeling of Stress : Not on file  Social Connections:   . Frequency of Communication with Friends and Family: Not on file  . Frequency of Social Gatherings with Friends and Family: Not on file  . Attends Religious Services: Not on file  . Active Member of Clubs or Organizations: Not on file  . Attends Archivist Meetings: Not on file  . Marital Status: Not on file  Intimate Partner Violence:   . Fear of Current or Ex-Partner: Not on file  . Emotionally Abused: Not on file  . Physically Abused: Not on file  . Sexually Abused: Not on file     Family History  Problem Relation Age of Onset  . Colon cancer Father        in his late 69's  . Colon polyps Sister   . Heart attack Sister   . Breast cancer Sister   . Heart attack Sister   . Esophageal cancer Neg Hx   . Rectal cancer Neg Hx   . Stomach cancer Neg Hx     Current Outpatient Medications  Medication Sig Dispense Refill  . albuterol (VENTOLIN HFA) 108 (90 Base) MCG/ACT inhaler albuterol sulfate HFA 90 mcg/actuation aerosol inhaler  TAKE 2 PUFFS BY MOUTH EVERY 4 TO 6 HOURS AS NEEDED    . alendronate (FOSAMAX) 70 MG tablet Fosamax 70 mg tablet  Take 1 tablet every week by oral route before meals.    Marland Kitchen amLODipine (NORVASC) 5 MG tablet Take 0.5 tablets (2.5 mg total) by mouth daily. 45 tablet 0  . aspirin EC 81 MG tablet Take 81 mg by mouth daily.     . Calcium Carbonate (CALCIUM 600 PO) Take 1 tablet by  mouth  daily.     . cetirizine (ZYRTEC) 10 MG tablet Take 10 mg by mouth as needed.     . diclofenac Sodium (VOLTAREN) 1 % GEL Apply 2 g topically 4 (four) times daily.    . finasteride (PROSCAR) 5 MG tablet Take 5 mg by mouth daily.     . Fluocinolone Acetonide Scalp 0.01 % OIL 2 (two) times a week.     . furosemide (LASIX) 40 MG tablet Take 1 tablet (40 mg total) by mouth 2 (two) times daily. 180 tablet 3  . ketoconazole (NIZORAL) 2 % shampoo Apply 1 application topically once a week.     . lovastatin (MEVACOR) 40 MG tablet Take 40 mg by mouth daily.    . metFORMIN (GLUCOPHAGE) 500 MG tablet Take 500 mg by mouth daily.    . methocarbamol (ROBAXIN) 750 MG tablet Take 750 mg by mouth daily as needed.     . metolazone (ZAROXOLYN) 5 MG tablet Take 1 tablet (5 mg total) by mouth daily. Take 30 minutes prior to your furosemide in the morning. 7 tablet 0  . metoprolol succinate (TOPROL-XL) 50 MG 24 hr tablet Take 50 mg by mouth daily.    . pantoprazole (PROTONIX) 20 MG tablet Take 20 mg by mouth daily.    . potassium chloride SA (KLOR-CON M20) 20 MEQ tablet Take 1 tablet (20 mEq total) by mouth daily. 90 tablet 1  . predniSONE (DELTASONE) 5 MG tablet Take 5 mg by mouth daily.    . pregabalin (LYRICA) 75 MG capsule Take 75 mg by mouth 2 (two) times daily.    . SYMBICORT 160-4.5 MCG/ACT inhaler Inhale 2 puffs into the lungs 2 (two) times daily.     . Tocilizumab (ACTEMRA) 162 MG/0.9ML SOSY once a week.     . Vitamin D, Ergocalciferol, (DRISDOL) 1.25 MG (50000 UT) CAPS capsule Take 50,000 Units by mouth daily.      Current Facility-Administered Medications  Medication Dose Route Frequency Provider Last Rate Last Admin  . 0.9 %  sodium chloride infusion  500 mL Intravenous Once Jackquline Denmark, MD        Allergies  Allergen Reactions  . Hydrocodone Shortness Of Breath    Pt states she has taken percocet with no issues and believes she has taken dilaudid but is not positive   . Magnesium-Containing  Compounds Anaphylaxis    Has been in hospital with last 2 colon preps     REVIEW OF SYSTEMS:   [X]  denotes positive finding, [ ]  denotes negative finding Cardiac  Comments:  Chest pain or chest pressure:    Shortness of breath upon exertion: x   Short of breath when lying flat:    Irregular heart rhythm:        Vascular    Pain in calf, thigh, or hip brought on by ambulation:    Pain in feet at night that wakes you up from your sleep:     Blood clot in your veins:    Leg swelling:         Pulmonary    Oxygen at home:    Productive cough:     Wheezing:  x       Neurologic    Sudden weakness in arms or legs:     Sudden numbness in arms or legs:     Sudden onset of difficulty speaking or slurred speech:    Temporary loss of vision in one eye:     Problems with  dizziness:  x       Gastrointestinal    Blood in stool:     Vomited blood:         Genitourinary    Burning when urinating:     Blood in urine:        Psychiatric    Major depression:         Hematologic    anemia x       Skin    Rashes or ulcers:        Constitutional    Fever or chills:      PHYSICAL EXAMINATION:  Today's Vitals   03/06/20 1243  BP: 135/84  Pulse: 92  Resp: 14  Temp: 97.6 F (36.4 C)  TempSrc: Temporal  SpO2: 96%  Weight: 279 lb (126.6 kg)  Height: 5\' 4"  (1.626 m)  PainSc: 10-Worst pain ever   Body mass index is 47.89 kg/m.   General:  WDWN in NAD; vital signs documented above Gait: Not observed HENT: WNL, normocephalic Pulmonary: normal non-labored breathing without wheezing Cardiac: regular HR; without carotid bruits Abdomen: soft, NT, no masses Skin: without rashes Vascular Exam/Pulses:  Right Left  Radial 2+ (normal) 2+ (normal)  DP Brisk biphasic Brisk monophasic  PT Brisk triphasic Brisk triphasic   Extremities: without ischemic changes, without cellulitis; without open wounds; with above bilateral ankle skin color changes Musculoskeletal: no muscle  wasting or atrophy  Neurologic: A&O X 3;  moving all extremities equally Psychiatric:  The pt has Normal affect.   Non-Invasive Vascular Imaging:   Venous duplex on 03/06/2020: Venous Reflux Times  +--------------+---------+------+-----------+------------+--------+  RIGHT     Reflux NoRefluxReflux TimeDiameter cmsComments               Yes                   +--------------+---------+------+-----------+------------+--------+  CFV            yes  >1 second             +--------------+---------+------+-----------+------------+--------+  FV mid    no                         +--------------+---------+------+-----------+------------+--------+  Popliteal   no                         +--------------+---------+------+-----------+------------+--------+  GSV at SFJ        yes  >500 ms   0.91        +--------------+---------+------+-----------+------------+--------+  GSV prox thighno               0.56        +--------------+---------+------+-----------+------------+--------+  GSV mid thigh no               0.61        +--------------+---------+------+-----------+------------+--------+  GSV dist thighno               0.30        +--------------+---------+------+-----------+------------+--------+  SSV Pop Fossa no               0.17        +--------------+---------+------+-----------+------------+--------+     +--------------+---------+------+-----------+------------+--------+  LEFT     Reflux NoRefluxReflux TimeDiameter cmsComments               Yes                   +--------------+---------+------+-----------+------------+--------+  CFV  yes  >1 second              +--------------+---------+------+-----------+------------+--------+  FV mid    no                         +--------------+---------+------+-----------+------------+--------+  Popliteal   no                         +--------------+---------+------+-----------+------------+--------+  GSV at [facility]  no               0.87        +--------------+---------+------+-----------+------------+--------+  GSV prox thighno               0.63        +--------------+---------+------+-----------+------------+--------+  GSV mid thigh no               0.43        +--------------+---------+------+-----------+------------+--------+  GSV dist thighno               0.46        +--------------+---------+------+-----------+------------+--------+  GSV at knee  no               0.48        +--------------+---------+------+-----------+------------+--------+  SSV Pop Fossa no               0.19        +--------------+---------+------+-----------+------------+--------+   Summary:  Right:  - No evidence of deep vein thrombosis from the common femoral through the  popliteal veins.  - No evidence of superficial venous thrombosis.  - Reflux noted in the common femoral vein.  - The great saphenous vein is competent.  - The small saphenous vein is competent.    Left:  - No evidence of deep vein thrombosis from the common femoral through the  popliteal veins.  - No evidence of superficial venous thrombosis.  - Reflux noted in the common femoral vein.  - The great saphenous vein is competent.  - The small saphenous vein is competent.    Cordella Romie Minus Jalyn Dutta is a 64 y.o. female who presents with: BLE swelling  The pt does have deep venous reflux but no superficial venous reflux.  She will benefit from wearing thigh high compression stockings.  She is measured today.  We do not have her size here in thigh high compression.   I will talk with vein RN here to see if we can get these for her.  I did discuss with pt to continue trying to wear her knee high compression of 15-70mmHg because I do think she will get some benefit from this with leg elevation.  If we can get her the thigh high, these may be more comfortable for her.      -discussed importance of weight loss however, given she is on prednisone, this may be difficult for her but hopeful she can work on this.  -she was given handout  -pt will f/u as needed.    Leontine Locket, [facility] 03/06/2020 12:19 PM  Clinic MD:  Carlis Abbott on call MD

## 2020-03-14 ENCOUNTER — Encounter: Payer: Self-pay | Admitting: Cardiology

## 2020-03-14 ENCOUNTER — Other Ambulatory Visit: Payer: Self-pay

## 2020-03-14 ENCOUNTER — Ambulatory Visit: Payer: BC Managed Care – PPO | Admitting: Cardiology

## 2020-03-14 VITALS — BP 151/80 | HR 80 | Ht 64.0 in | Wt 284.2 lb

## 2020-03-14 DIAGNOSIS — I480 Paroxysmal atrial fibrillation: Secondary | ICD-10-CM | POA: Diagnosis not present

## 2020-03-14 DIAGNOSIS — I10 Essential (primary) hypertension: Secondary | ICD-10-CM

## 2020-03-14 DIAGNOSIS — E782 Mixed hyperlipidemia: Secondary | ICD-10-CM | POA: Diagnosis not present

## 2020-03-14 MED ORDER — BUMETANIDE 2 MG PO TABS: 2.0000 mg | ORAL_TABLET | Freq: Every day | ORAL | 3 refills | Status: DC

## 2020-03-14 MED ORDER — POTASSIUM CHLORIDE CRYS ER 20 MEQ PO TBCR: 20.0000 meq | EXTENDED_RELEASE_TABLET | Freq: Two times a day (BID) | ORAL | 3 refills | Status: DC

## 2020-03-14 NOTE — Patient Instructions (Addendum)
Medication Instructions:  1) Start Bumex 2 mg twice daily   2) Start Potassium 20 meq twice daily   3) Stop Lasix  *If you need a refill on your cardiac medications before your next appointment, please call your pharmacy*   Lab Work: Your physician recommends that you return for lab work in: 1 week   If you have labs (blood work) drawn today and your tests are completely normal, you will receive your results only by: Marland Kitchen MyChart Message (if you have MyChart) OR . A paper copy in the mail If you have any lab test that is abnormal or we need to change your treatment, we will call you to review the results.   Testing/Procedures: None ordered    Follow-Up: At [facility], you and your health needs are our priority.  As part of our continuing mission to provide you with exceptional heart care, we have created designated Provider Care Teams.  These Care Teams include your primary Cardiologist (physician) and Advanced Practice Providers (APPs -  Physician Assistants and Nurse Practitioners) who all work together to provide you with the care you need, when you need it.  We recommend signing up for the patient portal called "MyChart".  Sign up information is provided on this After Visit Summary.  MyChart is used to connect with patients for Virtual Visits (Telemedicine).  Patients are able to view lab/test results, encounter notes, upcoming appointments, etc.  Non-urgent messages can be sent to your provider as well.   To learn more about what you can do with MyChart, go to [email or web address].    Your next appointment:   1 month(s)  The format for your next appointment:   In Person  Provider:   Berniece Salines, DO   Other Instructions None

## 2020-03-14 NOTE — Progress Notes (Signed)
Cardiology Office Note:    Date:  03/14/2020   ID:  Stefanie Campbell, DOB 1955-07-02, MRN [id number]  PCP:  Gala Lewandowsky, MD  Cardiologist:  Berniece Salines, DO  Electrophysiologist:  None   Referring MD: Gala Lewandowsky, MD   Follow up visit   History of Present Illness:    Stefanie Campbell a 64 y.o.femalewith a hx of hypertension, hyperlipidemia, giant cell arthritis on prednisone, significant family history of coronary artery disease,obesity, sleep apneaon cpap, diagnosis of paroxysmal atrial fibrillation many years ago.  I did see the patient February 26, 2020 at which time I added Zaroxolyn 5 mg for 7 days to her diuretic regimen.  She did take this medicine and she says she has some relief from this but thinks that the Lasix may not be completely working for her anymore.  There is no shortness of breath on exertion.  She denies any chest pain.   Past Medical History:  Diagnosis Date  . Aftercare following surgery 11/15/2017  . Allergy   . Anemia   . Arthritis   . Bilateral leg edema 11/15/2019  . Blood transfusion without reported diagnosis    allergic and had to stop 15 minutes in   . Chest pain 10/06/2019  . Chronic pain of right ankle 07/12/2017  . Chronic venous insufficiency 07/12/2017  . Class 3 severe obesity in adult ([place]) 11/24/2018  . Current use of steroid medication 01/26/2019  . Essential hypertension 10/06/2019  . GERD (gastroesophageal reflux disease)   . Giant cell arteritis ([place]) 11/24/2018  . Graves disease   . Hyperlipidemia   . Hypertension   . Liver disease   . Low back pain 03/12/2013  . Low back strain 02/16/2016  . Mixed hyperlipidemia 10/06/2019  . Osteoarthritis of hip 06/05/2018  . Other headache syndrome 01/26/2019  . PAF (paroxysmal atrial fibrillation) ([place]) 04/13/2015   Formatting of this note might be different from the original. CHADS2 vasc score= 2 Formatting of this note might be different from the original.  Overview:  CHADS2 vasc score= 2  . Pain in unspecified knee 03/12/2013  . Palpitations 10/06/2019  . Primary osteoarthritis of one hip, right 06/05/2018  . Sleep apnea    wears cpap   . Stroke [facility])    mini stroke per pt   . Syncope 04/13/2015  . Tendonitis of ankle, right 07/12/2017    Past Surgical History:  Procedure Laterality Date  . ABDOMINAL HYSTERECTOMY    . BREAST SURGERY     benign cyst  . CARPAL TUNNEL RELEASE Right   . CESAREAN SECTION     x2  . COLONOSCOPY    . FOOT SURGERY     on toe   . KNEE ARTHROSCOPY Left   . temperol biopsy    . TOTAL HIP ARTHROPLASTY Right     Current Medications: Current Meds  Medication Sig  . albuterol (VENTOLIN HFA) 108 (90 Base) MCG/ACT inhaler albuterol sulfate HFA 90 mcg/actuation aerosol inhaler  TAKE 2 PUFFS BY MOUTH EVERY 4 TO 6 HOURS AS NEEDED  . alendronate (FOSAMAX) 70 MG tablet Fosamax 70 mg tablet  Take 1 tablet every week by oral route before meals.  Marland Kitchen amLODipine (NORVASC) 5 MG tablet Take 0.5 tablets (2.5 mg total) by mouth daily.  Marland Kitchen aspirin EC 81 MG tablet Take 81 mg by mouth daily.   . Calcium Carbonate (CALCIUM 600 PO) Take 1 tablet by mouth daily.   . cetirizine (ZYRTEC) 10 MG tablet Take 10  mg by mouth as needed.   . Cholecalciferol (VITAMIN D) 50 MCG (2000 UT) CAPS Take 1 capsule by mouth daily.  . furosemide (LASIX) 40 MG tablet Take 1 tablet (40 mg total) by mouth 2 (two) times daily.  Marland Kitchen lovastatin (MEVACOR) 40 MG tablet Take 40 mg by mouth daily.  . metFORMIN (GLUCOPHAGE) 500 MG tablet Take 500 mg by mouth daily.  . metoprolol succinate (TOPROL-XL) 50 MG 24 hr tablet Take 50 mg by mouth daily.  . pantoprazole (PROTONIX) 20 MG tablet Take 20 mg by mouth daily.  . potassium chloride SA (KLOR-CON M20) 20 MEQ tablet Take 1 tablet (20 mEq total) by mouth daily.  . predniSONE (DELTASONE) 5 MG tablet Take 5 mg by mouth daily.  . pregabalin (LYRICA) 75 MG capsule Take 75 mg by mouth daily.   . SYMBICORT 160-4.5  MCG/ACT inhaler Inhale 2 puffs into the lungs 2 (two) times daily.   . Tocilizumab (ACTEMRA) 162 MG/0.9ML SOSY once a week.    Current Facility-Administered Medications for the 03/14/20 encounter (Office Visit) with Berniece Salines, DO  Medication  . 0.9 %  sodium chloride infusion     Allergies:   Hydrocodone and Magnesium-containing compounds   Social History   Socioeconomic History  . Marital status: Married    Spouse name: Not on file  . Number of children: Not on file  . Years of education: Not on file  . Highest education level: Not on file  Occupational History  . Not on file  Tobacco Use  . Smoking status: Never Smoker  . Smokeless tobacco: Never Used  Substance and Sexual Activity  . Alcohol use: Not Currently  . Drug use: Never  . Sexual activity: Not on file  Other Topics Concern  . Not on file  Social History Narrative  . Not on file   Social Determinants of Health   Financial Resource Strain:   . Difficulty of Paying Living Expenses: Not on file  Food Insecurity:   . Worried About Charity fundraiser in the Last Year: Not on file  . Ran Out of Food in the Last Year: Not on file  Transportation Needs:   . Lack of Transportation (Medical): Not on file  . Lack of Transportation (Non-Medical): Not on file  Physical Activity:   . Days of Exercise per Week: Not on file  . Minutes of Exercise per Session: Not on file  Stress:   . Feeling of Stress : Not on file  Social Connections:   . Frequency of Communication with Friends and Family: Not on file  . Frequency of Social Gatherings with Friends and Family: Not on file  . Attends Religious Services: Not on file  . Active Member of Clubs or Organizations: Not on file  . Attends Archivist Meetings: Not on file  . Marital Status: Not on file     Family History: The patient's family history includes Breast cancer in her sister; Colon cancer in her father; Colon polyps in her sister; Heart attack in her  sister and sister. There is no history of Esophageal cancer, Rectal cancer, or Stomach cancer.  ROS:   Review of Systems  Constitution: Negative for decreased appetite, fever and weight gain.  HENT: Negative for congestion, ear discharge, hoarse voice and sore throat.   Eyes: Negative for discharge, redness, vision loss in right eye and visual halos.  Cardiovascular: Negative for chest pain, dyspnea on exertion, leg swelling, orthopnea and palpitations.  Respiratory: Negative  for cough, hemoptysis, shortness of breath and snoring.   Endocrine: Negative for heat intolerance and polyphagia.  Hematologic/Lymphatic: Negative for bleeding problem. Does not bruise/bleed easily.  Skin: Negative for flushing, nail changes, rash and suspicious lesions.  Musculoskeletal: Negative for arthritis, joint pain, muscle cramps, myalgias, neck pain and stiffness.  Gastrointestinal: Negative for abdominal pain, bowel incontinence, diarrhea and excessive appetite.  Genitourinary: Negative for decreased libido, genital sores and incomplete emptying.  Neurological: Negative for brief paralysis, focal weakness, headaches and loss of balance.  Psychiatric/Behavioral: Negative for altered mental status, depression and suicidal ideas.  Allergic/Immunologic: Negative for HIV exposure and persistent infections.    EKGs/Labs/Other Studies Reviewed:    The following studies were reviewed today:   EKG:  The ekg ordered today demonstrates    TTE Impression 10/25/2019 1. Left ventricular ejection fraction, by estimation, is 50 to 55%. The  left ventricle has low normal function on limited apical views only. Left ventricular endocardial border not optimally defined to evaluate regional wall motion. Left ventricular  diastolic parameters are consistent with Grade I diastolic dysfunction (impaired relaxation).  2. Right ventricular systolic function was not well visualized. The right ventricular size is not well  visualized.  3. The mitral valve was not well visualized. No evidence of mitral valve regurgitation.  4. The aortic valve was not well visualized. Aortic valve regurgitation is not visualized. No aortic stenosis is present.  5. The inferior vena cava is normal in size with greater than 50% respiratory variability, suggesting right atrial pressure of 3 mmHg.  Pharmacologic stress test 10/25/2019  Nuclear stress EF: 57%. The left ventricular ejection fraction is normal (55-65%).  There was no ST segment deviation noted during stress.  Defect 1: There is a small defect of moderate severity present in the mid inferior, apical inferior and apex location. This is most c/w breast and diaphragmatic attenuation. The apex and apical inferior walls contract normally .  This is a low risk study. No evidence of ischemia or previous infarction The study is normal.  Recent Labs: 02/26/2020: BUN 19; Creatinine, Ser 1.10; Magnesium 1.7; NT-Pro BNP 102; Potassium 4.4; Sodium 143  Recent Lipid Panel No results found for: CHOL, TRIG, HDL, CHOLHDL, VLDL, LDLCALC, LDLDIRECT  Physical Exam:    VS:  BP (!) 151/80   Pulse 80   Ht 5\' 4"  (1.626 m)   Wt 284 lb 3.2 oz (128.9 kg)   SpO2 93%   BMI 48.78 kg/m     Wt Readings from Last 3 Encounters:  03/14/20 284 lb 3.2 oz (128.9 kg)  03/06/20 279 lb (126.6 kg)  02/26/20 289 lb (131.1 kg)     GEN: Well nourished, well developed in no acute distress HEENT: Normal NECK: No JVD; No carotid bruits LYMPHATICS: No lymphadenopathy CARDIAC: S1S2 noted,RRR, no murmurs, rubs, gallops RESPIRATORY:  Clear to auscultation without rales, wheezing or rhonchi  ABDOMEN: Soft, non-tender, non-distended, +bowel sounds, no guarding. EXTREMITIES: Bilateral +1 edema, No cyanosis, no clubbing MUSCULOSKELETAL:  No deformity  SKIN: Warm and dry NEUROLOGIC:  Alert and oriented x 3, non-focal PSYCHIATRIC:  Normal affect, good insight  ASSESSMENT:    1. Essential  hypertension   2. PAF (paroxysmal atrial fibrillation) ([place])   3. Mixed hyperlipidemia   4. Morbid obesity ([place])    PLAN:     I am going to stop the Lasix today and start the patient on Bumex 2 mg twice daily.  She will come next week to get blood work to assess her  kidney function as well as creatinine.   Her blood pressure is elevated in the office today, this is an isolated elevated reading therefore I am not going to increase her medication.  Advised the patient to take her blood pressure daily and send me this information.    Diabetes mellitus continue patient on current medication regimen.  Paroxysmal atrial fibrillation-many years with isolated recurrence.  We discussed potentially putting a monitor on the patient to assess for any recurrence of atrial fibrillation.  The patient understands the need to lose weight with diet and exercise. We have discussed specific strategies for this.  The patient is in agreement with the above plan. The patient left the office in stable condition.  The patient will follow up in 1 month or sooner if needed.   Medication Adjustments/Labs and Tests Ordered: Current medicines are reviewed at length with the patient today.  Concerns regarding medicines are outlined above.  No orders of the defined types were placed in this encounter.  No orders of the defined types were placed in this encounter.   Patient Instructions  Medication Instructions:  1) Start Bumex 2 mg twice daily   2) Start Potassium 20 meq twice daily   *If you need a refill on your cardiac medications before your next appointment, please call your pharmacy*   Lab Work: Your physician recommends that you return for lab work in: 1 week   If you have labs (blood work) drawn today and your tests are completely normal, you will receive your results only by: Marland Kitchen MyChart Message (if you have MyChart) OR . A paper copy in the mail If you have any lab test that is abnormal or we  need to change your treatment, we will call you to review the results.   Testing/Procedures: None ordered    Follow-Up: At [facility], you and your health needs are our priority.  As part of our continuing mission to provide you with exceptional heart care, we have created designated Provider Care Teams.  These Care Teams include your primary Cardiologist (physician) and Advanced Practice Providers (APPs -  Physician Assistants and Nurse Practitioners) who all work together to provide you with the care you need, when you need it.  We recommend signing up for the patient portal called "MyChart".  Sign up information is provided on this After Visit Summary.  MyChart is used to connect with patients for Virtual Visits (Telemedicine).  Patients are able to view lab/test results, encounter notes, upcoming appointments, etc.  Non-urgent messages can be sent to your provider as well.   To learn more about what you can do with MyChart, go to [email or web address].    Your next appointment:   1 month(s)  The format for your next appointment:   In Person  Provider:   Berniece Salines, DO   Other Instructions None      Adopting a Healthy Lifestyle.  Know what a healthy weight is for you (roughly BMI <25) and aim to maintain this   Aim for 7+ servings of fruits and vegetables daily   65-80+ fluid ounces of water or unsweet tea for healthy kidneys   Limit to max 1 drink of alcohol per day; avoid smoking/tobacco   Limit animal fats in diet for cholesterol and heart health - choose grass fed whenever available   Avoid highly processed foods, and foods high in saturated/trans fats   Aim for low stress - take time to unwind and care for your mental health  Aim for 150 min of moderate intensity exercise weekly for heart health, and weights twice weekly for bone health   Aim for 7-9 hours of sleep daily   When it comes to diets, agreement about the perfect plan isnt easy to find, even  among the experts. Experts at the [employer] developed an idea known as the Healthy Eating Plate. Just imagine a plate divided into logical, healthy portions.   The emphasis is on diet quality:   Load up on vegetables and fruits - one-half of your plate: Aim for color and variety, and remember that potatoes dont count.   Go for whole grains - one-quarter of your plate: Whole wheat, barley, wheat berries, quinoa, oats, brown rice, and foods made with them. If you want pasta, go with whole wheat pasta.   Protein power - one-quarter of your plate: Fish, chicken, beans, and nuts are all healthy, versatile protein sources. Limit red meat.   The diet, however, does go beyond the plate, offering a few other suggestions.   Use healthy plant oils, such as olive, canola, soy, corn, sunflower and peanut. Check the labels, and avoid partially hydrogenated oil, which have unhealthy trans fats.   If youre thirsty, drink water. Coffee and tea are good in moderation, but skip sugary drinks and limit milk and dairy products to one or two daily servings.   The type of carbohydrate in the diet is more important than the amount. Some sources of carbohydrates, such as vegetables, fruits, whole grains, and beans-are healthier than others.   Finally, stay active  Signed, Berniece Salines, DO  03/14/2020 4:01 PM    [facility]

## 2020-03-21 LAB — BASIC METABOLIC PANEL
BUN/Creatinine Ratio: 19 (ref 12–28)
BUN: 23 mg/dL (ref 8–27)
CO2: 23 mmol/L (ref 20–29)
Calcium: 9.8 mg/dL (ref 8.7–10.3)
Chloride: 106 mmol/L (ref 96–106)
Creatinine, Ser: 1.18 mg/dL — ABNORMAL HIGH (ref 0.57–1.00)
GFR calc Af Amer: 56 mL/min/{1.73_m2} — ABNORMAL LOW (ref >59)
GFR calc non Af Amer: 49 mL/min/{1.73_m2} — ABNORMAL LOW (ref >59)
Glucose: 128 mg/dL — ABNORMAL HIGH (ref 65–99)
Potassium: 4.5 mmol/L (ref 3.5–5.2)
Sodium: 143 mmol/L (ref 134–144)

## 2020-03-21 LAB — MAGNESIUM: Magnesium: 1.6 mg/dL (ref 1.6–2.3)

## 2020-03-28 ENCOUNTER — Ambulatory Visit: Payer: BC Managed Care – PPO | Admitting: Cardiology

## 2020-04-04 ENCOUNTER — Encounter: Payer: Self-pay | Admitting: Cardiology

## 2020-04-04 ENCOUNTER — Ambulatory Visit: Payer: BC Managed Care – PPO | Admitting: Cardiology

## 2020-04-04 ENCOUNTER — Other Ambulatory Visit: Payer: Self-pay

## 2020-04-04 VITALS — BP 126/74 | HR 90 | Ht 64.0 in | Wt 276.6 lb

## 2020-04-04 DIAGNOSIS — Z79899 Other long term (current) drug therapy: Secondary | ICD-10-CM | POA: Diagnosis not present

## 2020-04-04 DIAGNOSIS — I10 Essential (primary) hypertension: Secondary | ICD-10-CM | POA: Diagnosis not present

## 2020-04-04 DIAGNOSIS — I5032 Chronic diastolic (congestive) heart failure: Secondary | ICD-10-CM

## 2020-04-04 DIAGNOSIS — I480 Paroxysmal atrial fibrillation: Secondary | ICD-10-CM

## 2020-04-04 DIAGNOSIS — E782 Mixed hyperlipidemia: Secondary | ICD-10-CM

## 2020-04-04 DIAGNOSIS — G4733 Obstructive sleep apnea (adult) (pediatric): Secondary | ICD-10-CM

## 2020-04-04 NOTE — Patient Instructions (Signed)
Medication Instructions:  Your physician has recommended you make the following change in your medication:   Take: Extra 1 mg (1/2 tablet) of Bumex if you gain 3 lbs in 1 day or 5 lbs in 1 week.  *If you need a refill on your cardiac medications before your next appointment, please call your pharmacy*   Lab Work: Your physician recommends that you return for lab work in:  Bmp, MG  If you have labs (blood work) drawn today and your tests are completely normal, you will receive your results only by: Marland Kitchen MyChart Message (if you have MyChart) OR . A paper copy in the mail If you have any lab test that is abnormal or we need to change your treatment, we will call you to review the results.   Testing/Procedures: None.   Follow-Up: At [facility], you and your health needs are our priority.  As part of our continuing mission to provide you with exceptional heart care, we have created designated Provider Care Teams.  These Care Teams include your primary Cardiologist (physician) and Advanced Practice Providers (APPs -  Physician Assistants and Nurse Practitioners) who all work together to provide you with the care you need, when you need it.  We recommend signing up for the patient portal called "MyChart".  Sign up information is provided on this After Visit Summary.  MyChart is used to connect with patients for Virtual Visits (Telemedicine).  Patients are able to view lab/test results, encounter notes, upcoming appointments, etc.  Non-urgent messages can be sent to your provider as well.   To learn more about what you can do with MyChart, go to [email or web address].    Your next appointment:   6 month(s)  The format for your next appointment:   In Person  Provider:   Berniece Salines, DO   Other Instructions

## 2020-04-04 NOTE — Progress Notes (Signed)
Cardiology Office Note:    Date:  04/04/2020   ID:  Stefanie Campbell, DOB 05/04/1956, MRN [id number]  PCP:  Gala Lewandowsky, MD  Cardiologist:  Berniece Salines, DO  Electrophysiologist:  None   Referring MD: Gala Lewandowsky, MD   " I am doing well"  History of Present Illness:    Stefanie Campbell a 64 y.o.femalewith a hx of hypertension, hyperlipidemia, giant cell arthritis on prednisone, significant family history of coronary artery disease,obesity, sleep apneaon cpap,diagnosis of paroxysmal atrial fibrillation many years ago.  I did see the patient February 26, 2020 at which time I added Zaroxolyn 5 mg for 7 days to her diuretic regimen.  She did take this medicine and she says she has some relief from this but thinks that the Lasix may not be completely working for her anymore.  I saw the patient March 14, 2020 at that time and stopped her Lasix and started the patient on Bumex 2 mg twice daily.  She is here today for follow-up visit.  She is really happy with her result from the Bumex.  She tell me her leg edema has improved significantly.  Her blood pressure also has improved.  No other complaints at this time.   Past Medical History:  Diagnosis Date  . Aftercare following surgery 11/15/2017  . Allergy   . Anemia   . Arthritis   . Bilateral leg edema 11/15/2019  . Blood transfusion without reported diagnosis    allergic and had to stop 15 minutes in   . Chest pain 10/06/2019  . Chronic pain of right ankle 07/12/2017  . Chronic venous insufficiency 07/12/2017  . Class 3 severe obesity in adult ([place]) 11/24/2018  . Current use of steroid medication 01/26/2019  . Essential hypertension 10/06/2019  . GERD (gastroesophageal reflux disease)   . Giant cell arteritis ([place]) 11/24/2018  . Graves disease   . Hyperlipidemia   . Hypertension   . Liver disease   . Low back pain 03/12/2013  . Low back strain 02/16/2016  . Mixed hyperlipidemia 10/06/2019  .  Morbid obesity ([place] [address] [place]) 02/26/2020  . Osteoarthritis of hip 06/05/2018  . Other headache syndrome 01/26/2019  . PAF (paroxysmal atrial fibrillation) ([place]) 04/13/2015   Formatting of this note might be different from the original. CHADS2 vasc score= 2 Formatting of this note might be different from the original. Overview:  CHADS2 vasc score= 2  . Pain in unspecified knee 03/12/2013  . Palpitations 10/06/2019  . Primary osteoarthritis of one hip, right 06/05/2018  . Sleep apnea    wears cpap   . Stroke Stefanie Campbell)    mini stroke per pt   . Syncope 04/13/2015  . Tendonitis of ankle, right 07/12/2017    Past Surgical History:  Procedure Laterality Date  . ABDOMINAL HYSTERECTOMY    . BREAST SURGERY     benign cyst  . CARPAL TUNNEL RELEASE Right   . CESAREAN SECTION     x2  . COLONOSCOPY    . FOOT SURGERY     on toe   . KNEE ARTHROSCOPY Left   . temperol biopsy    . TOTAL HIP ARTHROPLASTY Right     Current Medications: Current Meds  Medication Sig  . albuterol (VENTOLIN HFA) 108 (90 Base) MCG/ACT inhaler albuterol sulfate HFA 90 mcg/actuation aerosol inhaler  TAKE 2 PUFFS BY MOUTH EVERY 4 TO 6 HOURS AS NEEDED  . alendronate (FOSAMAX) 70 MG tablet Fosamax 70 mg tablet  Take 1 tablet every  week by oral route before meals.  Marland Kitchen amLODipine (NORVASC) 5 MG tablet Take 0.5 tablets (2.5 mg total) by mouth daily.  Marland Kitchen aspirin EC 81 MG tablet Take 81 mg by mouth daily.   . bumetanide (BUMEX) 2 MG tablet Take 1 tablet (2 mg total) by mouth daily.  . Calcium Carbonate (CALCIUM 600 PO) Take 1 tablet by mouth daily.   . cetirizine (ZYRTEC) 10 MG tablet Take 10 mg by mouth as needed.   . Cholecalciferol (VITAMIN D) 50 MCG (2000 UT) CAPS Take 1 capsule by mouth daily.  Marland Kitchen lisinopril (ZESTRIL) 40 MG tablet Take 40 mg by mouth daily.  Marland Kitchen lovastatin (MEVACOR) 40 MG tablet Take 40 mg by mouth daily.  . metFORMIN (GLUCOPHAGE) 500 MG tablet Take 500 mg by mouth daily.  . methocarbamol (ROBAXIN) 750 MG tablet Take 750  mg by mouth daily as needed.   . metoprolol succinate (TOPROL-XL) 50 MG 24 hr tablet Take 50 mg by mouth daily.  . pantoprazole (PROTONIX) 20 MG tablet Take 20 mg by mouth daily.  . potassium chloride SA (KLOR-CON M20) 20 MEQ tablet Take 1 tablet (20 mEq total) by mouth 2 (two) times daily.  . predniSONE (DELTASONE) 5 MG tablet Take 5 mg by mouth daily.  . SYMBICORT 160-4.5 MCG/ACT inhaler Inhale 2 puffs into the lungs 2 (two) times daily.   . Tocilizumab (ACTEMRA) 162 MG/0.9ML SOSY once a week.    Current Facility-Administered Medications for the 04/04/20 encounter (Office Visit) with Berniece Salines, DO  Medication  . 0.9 %  sodium chloride infusion     Allergies:   Hydrocodone and Magnesium-containing compounds   Social History   Socioeconomic History  . Marital status: Married    Spouse name: Not on file  . Number of children: Not on file  . Years of education: Not on file  . Highest education level: Not on file  Occupational History  . Not on file  Tobacco Use  . Smoking status: Never Smoker  . Smokeless tobacco: Never Used  Substance and Sexual Activity  . Alcohol use: Not Currently  . Drug use: Never  . Sexual activity: Not on file  Other Topics Concern  . Not on file  Social History Narrative  . Not on file   Social Determinants of Health   Financial Resource Strain:   . Difficulty of Paying Living Expenses: Not on file  Food Insecurity:   . Worried About Charity fundraiser in the Last Year: Not on file  . Ran Out of Food in the Last Year: Not on file  Transportation Needs:   . Lack of Transportation (Medical): Not on file  . Lack of Transportation (Non-Medical): Not on file  Physical Activity:   . Days of Exercise per Week: Not on file  . Minutes of Exercise per Session: Not on file  Stress:   . Feeling of Stress : Not on file  Social Connections:   . Frequency of Communication with Friends and Family: Not on file  . Frequency of Social Gatherings with  Friends and Family: Not on file  . Attends Religious Services: Not on file  . Active Member of Clubs or Organizations: Not on file  . Attends Archivist Meetings: Not on file  . Marital Status: Not on file     Family History: The patient's family history includes Breast cancer in her sister; Colon cancer in her father; Colon polyps in her sister; Heart attack in her sister and  sister. There is no history of Esophageal cancer, Rectal cancer, or Stomach cancer.  ROS:   Review of Systems  Constitution: Negative for decreased appetite, fever and weight gain.  HENT: Negative for congestion, ear discharge, hoarse voice and sore throat.   Eyes: Negative for discharge, redness, vision loss in right eye and visual halos.  Cardiovascular: Negative for chest pain, dyspnea on exertion, leg swelling, orthopnea and palpitations.  Respiratory: Negative for cough, hemoptysis, shortness of breath and snoring.   Endocrine: Negative for heat intolerance and polyphagia.  Hematologic/Lymphatic: Negative for bleeding problem. Does not bruise/bleed easily.  Skin: Negative for flushing, nail changes, rash and suspicious lesions.  Musculoskeletal: Negative for arthritis, joint pain, muscle cramps, myalgias, neck pain and stiffness.  Gastrointestinal: Negative for abdominal pain, bowel incontinence, diarrhea and excessive appetite.  Genitourinary: Negative for decreased libido, genital sores and incomplete emptying.  Neurological: Negative for brief paralysis, focal weakness, headaches and loss of balance.  Psychiatric/Behavioral: Negative for altered mental status, depression and suicidal ideas.  Allergic/Immunologic: Negative for HIV exposure and persistent infections.    EKGs/Labs/Other Studies Reviewed:    The following studies were reviewed today:   EKG: None today  TTE Impression 10/25/2019 1. Left ventricular ejection fraction, by estimation, is 50 to 55%. The  left ventricle has low  normal function on limited apical views only. Left ventricular endocardial border not optimally defined to evaluate regional wall motion. Left ventricular  diastolic parameters are consistent with Grade I diastolic dysfunction (impaired relaxation).  2. Right ventricular systolic function was not well visualized. The right ventricular size is not well visualized.  3. The mitral valve was not well visualized. No evidence of mitral valve regurgitation.  4. The aortic valve was not well visualized. Aortic valve regurgitation is not visualized. No aortic stenosis is present.  5. The inferior vena cava is normal in size with greater than 50% respiratory variability, suggesting right atrial pressure of 3 mmHg.  Pharmacologic stress test 10/25/2019  Nuclear stress EF: 57%. The left ventricular ejection fraction is normal (55-65%).  There was no ST segment deviation noted during stress.  Defect 1: There is a small defect of moderate severity present in the mid inferior, apical inferior and apex location. This is most c/w breast and diaphragmatic attenuation. The apex and apical inferior walls contract normally .  This is a low risk study. No evidence of ischemia or previous infarction The study is normal.  Recent Labs: 02/26/2020: NT-Pro BNP 102 03/20/2020: BUN 23; Creatinine, Ser 1.18; Magnesium 1.6; Potassium 4.5; Sodium 143  Recent Lipid Panel No results found for: CHOL, TRIG, HDL, CHOLHDL, VLDL, LDLCALC, LDLDIRECT  Physical Exam:    VS:  BP 126/74   Pulse 90   Ht 5\' 4"  (1.626 m)   Wt 276 lb 9.6 oz (125.5 kg)   SpO2 94%   BMI 47.48 kg/m     Wt Readings from Last 3 Encounters:  04/04/20 276 lb 9.6 oz (125.5 kg)  03/14/20 284 lb 3.2 oz (128.9 kg)  03/06/20 279 lb (126.6 kg)     GEN: Well nourished, well developed in no acute distress HEENT: Normal NECK: No JVD; No carotid bruits LYMPHATICS: No lymphadenopathy CARDIAC: S1S2 noted,RRR, no murmurs, rubs, gallops RESPIRATORY:   Clear to auscultation without rales, wheezing or rhonchi  ABDOMEN: Soft, non-tender, non-distended, +bowel sounds, no guarding. EXTREMITIES: No edema, No cyanosis, no clubbing MUSCULOSKELETAL:  No deformity  SKIN: Warm and dry NEUROLOGIC:  Alert and oriented x 3, non-focal PSYCHIATRIC:  Normal  affect, good insight  ASSESSMENT:    1. Medication management   2. Essential hypertension   3. PAF (paroxysmal atrial fibrillation) ([place])   4. Chronic diastolic heart failure ([place])   5. Obstructive sleep apnea syndrome   6. Mixed hyperlipidemia    PLAN:     1.  I am very happy with her improvement.  Her leg edema has improved significantly.  She has responded well to the Bumex.  She will now be on Bumex 2 mg daily.  I have asked the patient if she gains 3 pounds in 2 days and 5 pounds in 1 week to take an extra Bumex dose for the afternoon.  We will get blood work today to assess her kidney function.  Her blood pressure also has improved.  Diabetes mellitus per her PCP.  Continue patient on her lovastatin.  The patient understands the need to lose weight with diet and exercise. We have discussed specific strategies for this.  The patient is in agreement with the above plan. The patient left the office in stable condition.  The patient will follow up in   Medication Adjustments/Labs and Tests Ordered: Current medicines are reviewed at length with the patient today.  Concerns regarding medicines are outlined above.  Orders Placed This Encounter  Procedures  . Basic metabolic panel  . Magnesium   No orders of the defined types were placed in this encounter.   Patient Instructions  Medication Instructions:  Your physician has recommended you make the following change in your medication:   Take: Extra 1 mg (1/2 tablet) of Bumex if you gain 3 lbs in 1 day or 5 lbs in 1 week.  *If you need a refill on your cardiac medications before your next appointment, please call your  pharmacy*   Lab Work: Your physician recommends that you return for lab work in:  Bmp, MG  If you have labs (blood work) drawn today and your tests are completely normal, you will receive your results only by: Marland Kitchen MyChart Message (if you have MyChart) OR . A paper copy in the mail If you have any lab test that is abnormal or we need to change your treatment, we will call you to review the results.   Testing/Procedures: None.   Follow-Up: At [facility], you and your health needs are our priority.  As part of our continuing mission to provide you with exceptional heart care, we have created designated Provider Care Teams.  These Care Teams include your primary Cardiologist (physician) and Advanced Practice Providers (APPs -  Physician Assistants and Nurse Practitioners) who all work together to provide you with the care you need, when you need it.  We recommend signing up for the patient portal called "MyChart".  Sign up information is provided on this After Visit Summary.  MyChart is used to connect with patients for Virtual Visits (Telemedicine).  Patients are able to view lab/test results, encounter notes, upcoming appointments, etc.  Non-urgent messages can be sent to your provider as well.   To learn more about what you can do with MyChart, go to [email or web address].    Your next appointment:   6 month(s)  The format for your next appointment:   In Person  Provider:   Berniece Salines, DO   Other Instructions       Adopting a Healthy Lifestyle.  Know what a healthy weight is for you (roughly BMI <25) and aim to maintain this   Aim for 7+ servings of fruits  and vegetables daily   65-80+ fluid ounces of water or unsweet tea for healthy kidneys   Limit to max 1 drink of alcohol per day; avoid smoking/tobacco   Limit animal fats in diet for cholesterol and heart health - choose grass fed whenever available   Avoid highly processed foods, and foods high in  saturated/trans fats   Aim for low stress - take time to unwind and care for your mental health   Aim for 150 min of moderate intensity exercise weekly for heart health, and weights twice weekly for bone health   Aim for 7-9 hours of sleep daily   When it comes to diets, agreement about the perfect plan isnt easy to find, even among the experts. Experts at the [employer] developed an idea known as the Healthy Eating Plate. Just imagine a plate divided into logical, healthy portions.   The emphasis is on diet quality:   Load up on vegetables and fruits - one-half of your plate: Aim for color and variety, and remember that potatoes dont count.   Go for whole grains - one-quarter of your plate: Whole wheat, barley, wheat berries, quinoa, oats, brown rice, and foods made with them. If you want pasta, go with whole wheat pasta.   Protein power - one-quarter of your plate: Fish, chicken, beans, and nuts are all healthy, versatile protein sources. Limit red meat.   The diet, however, does go beyond the plate, offering a few other suggestions.   Use healthy plant oils, such as olive, canola, soy, corn, sunflower and peanut. Check the labels, and avoid partially hydrogenated oil, which have unhealthy trans fats.   If youre thirsty, drink water. Coffee and tea are good in moderation, but skip sugary drinks and limit milk and dairy products to one or two daily servings.   The type of carbohydrate in the diet is more important than the amount. Some sources of carbohydrates, such as vegetables, fruits, whole grains, and beans-are healthier than others.   Finally, stay active  Signed, Berniece Salines, DO  04/04/2020 10:43 PM    [facility]

## 2020-04-05 LAB — BASIC METABOLIC PANEL
BUN/Creatinine Ratio: 15 (ref 12–28)
BUN: 25 mg/dL (ref 8–27)
CO2: 25 mmol/L (ref 20–29)
Calcium: 10.5 mg/dL — ABNORMAL HIGH (ref 8.7–10.3)
Chloride: 102 mmol/L (ref 96–106)
Creatinine, Ser: 1.69 mg/dL — ABNORMAL HIGH (ref 0.57–1.00)
GFR calc Af Amer: 36 mL/min/{1.73_m2} — ABNORMAL LOW (ref >59)
GFR calc non Af Amer: 32 mL/min/{1.73_m2} — ABNORMAL LOW (ref >59)
Glucose: 134 mg/dL — ABNORMAL HIGH (ref 65–99)
Potassium: 4.3 mmol/L (ref 3.5–5.2)
Sodium: 138 mmol/L (ref 134–144)

## 2020-04-05 LAB — MAGNESIUM: Magnesium: 1.6 mg/dL (ref 1.6–2.3)

## 2020-04-07 ENCOUNTER — Other Ambulatory Visit: Payer: Self-pay

## 2020-04-07 MED ORDER — BUMETANIDE 2 MG PO TABS
1.0000 mg | ORAL_TABLET | Freq: Every day | ORAL | 3 refills | Status: DC
Start: 2020-04-07 — End: 2024-03-23

## 2020-04-20 ENCOUNTER — Other Ambulatory Visit: Payer: Self-pay | Admitting: Cardiology

## 2020-05-14 ENCOUNTER — Telehealth: Payer: Self-pay

## 2020-05-14 DIAGNOSIS — I8393 Asymptomatic varicose veins of bilateral lower extremities: Secondary | ICD-10-CM

## 2020-05-14 NOTE — Telephone Encounter (Signed)
Pt called requesting her custom ordered hose be shipped to her. We have taken payment over phone and put her compression hose in mail to go out for delivery. Pt is aware and has no further questions/concerns at this time.

## 2020-10-03 ENCOUNTER — Other Ambulatory Visit: Payer: Self-pay

## 2020-10-03 ENCOUNTER — Ambulatory Visit: Payer: Medicare HMO | Admitting: Cardiology

## 2020-10-03 ENCOUNTER — Encounter: Payer: Self-pay | Admitting: Cardiology

## 2020-10-03 VITALS — BP 130/78 | HR 78 | Ht 64.0 in | Wt 271.2 lb

## 2020-10-03 DIAGNOSIS — I5032 Chronic diastolic (congestive) heart failure: Secondary | ICD-10-CM

## 2020-10-03 DIAGNOSIS — E782 Mixed hyperlipidemia: Secondary | ICD-10-CM | POA: Diagnosis not present

## 2020-10-03 DIAGNOSIS — G4733 Obstructive sleep apnea (adult) (pediatric): Secondary | ICD-10-CM

## 2020-10-03 DIAGNOSIS — I10 Essential (primary) hypertension: Secondary | ICD-10-CM | POA: Diagnosis not present

## 2020-10-03 NOTE — Progress Notes (Signed)
Cardiology Office Note:    Date:  10/03/2020   ID:  Stefanie Campbell, DOB Aug 10, 1955, MRN [id number]  PCP:  Gala Lewandowsky, MD  Cardiologist:  Berniece Salines, DO  Electrophysiologist:  None   Referring MD: Gala Lewandowsky, MD   I am doing a lot better since changing the the water pill  History of Present Illness:    Stefanie Campbell is a 65 y.o. female with a hx of hypertension, hyperlipidemia, giant cell arthritis on prednisone, significant family history of coronary artery disease,obesity, sleep apneaon cpap,diagnosis of paroxysmal atrial fibrillation many years ago but the patient tells me that this was never confirmed.  I did see the patient February 26, 2020 at which time I added Zaroxolyn 5 mg for 7 days to her diuretic regimen. She did take this medicine and she says she has some relief from this but thinks that the Lasix may not be completely working for her anymore.  I saw the patient March 14, 2020 at that time and stopped her Lasix and started the patient on Bumex 2 mg twice daily.    I saw the patient on April 04, 2020 at that time she appeared to be doing well from a cardiovascular standpoint.  She had had significant improvement with Bumex in terms of her leg edema.  She is here today for follow-up visit.  She does not have any complaint.  She tells me that she has been steady.  She has had no hospitalizations no ER visit.  Past Medical History:  Diagnosis Date  . Aftercare following surgery 11/15/2017  . Allergy   . Anemia   . Arthritis   . Bilateral leg edema 11/15/2019  . Blood transfusion without reported diagnosis    allergic and had to stop 15 minutes in   . Chest pain 10/06/2019  . Chronic pain of right ankle 07/12/2017  . Chronic venous insufficiency 07/12/2017  . Class 3 severe obesity in adult ([place]) 11/24/2018  . Current use of steroid medication 01/26/2019  . Essential hypertension 10/06/2019  . GERD (gastroesophageal reflux  disease)   . Giant cell arteritis ([place]) 11/24/2018  . Graves disease   . Hyperlipidemia   . Hypertension   . Liver disease   . Low back pain 03/12/2013  . Low back strain 02/16/2016  . Mixed hyperlipidemia 10/06/2019  . Morbid obesity ([place]) 02/26/2020  . Osteoarthritis of hip 06/05/2018  . Other headache syndrome 01/26/2019  . PAF (paroxysmal atrial fibrillation) ([place]) 04/13/2015   Formatting of this note might be different from the original. CHADS2 vasc score= 2 Formatting of this note might be different from the original. Overview:  CHADS2 vasc score= 2  . Pain in unspecified knee 03/12/2013  . Palpitations 10/06/2019  . Primary osteoarthritis of one hip, right 06/05/2018  . Sleep apnea    wears cpap   . Stroke [facility])    mini stroke per pt   . Syncope 04/13/2015  . Tendonitis of ankle, right 07/12/2017    Past Surgical History:  Procedure Laterality Date  . ABDOMINAL HYSTERECTOMY    . BREAST SURGERY     benign cyst  . CARPAL TUNNEL RELEASE Right   . CESAREAN SECTION     x2  . COLONOSCOPY    . FOOT SURGERY     on toe   . KNEE ARTHROSCOPY Left   . temperol biopsy    . TOTAL HIP ARTHROPLASTY Right     Current Medications: Current Meds  Medication Sig  .  albuterol (VENTOLIN HFA) 108 (90 Base) MCG/ACT inhaler albuterol sulfate HFA 90 mcg/actuation aerosol inhaler  TAKE 2 PUFFS BY MOUTH EVERY 4 TO 6 HOURS AS NEEDED  . alendronate (FOSAMAX) 70 MG tablet Fosamax 70 mg tablet  Take 1 tablet every week by oral route before meals.  Marland Kitchen amLODipine (NORVASC) 5 MG tablet TAKE 1/2 TABLET BY MOUTH DAILY  . aspirin EC 81 MG tablet Take 81 mg by mouth daily.   . bumetanide (BUMEX) 2 MG tablet Take 0.5 tablets (1 mg total) by mouth daily.  . Calcium Carbonate (CALCIUM 600 PO) Take 1 tablet by mouth daily.   . cetirizine (ZYRTEC) 10 MG tablet Take 10 mg by mouth as needed.   . Cholecalciferol (VITAMIN D) 50 MCG (2000 UT) CAPS Take 1 capsule by mouth daily.  Marland Kitchen lisinopril (ZESTRIL) 40 MG tablet  Take 40 mg by mouth daily.  Marland Kitchen lovastatin (MEVACOR) 40 MG tablet Take 40 mg by mouth daily.  . metFORMIN (GLUCOPHAGE) 500 MG tablet Take 500 mg by mouth daily.  . methocarbamol (ROBAXIN) 750 MG tablet Take 750 mg by mouth daily as needed.   . metoprolol succinate (TOPROL-XL) 50 MG 24 hr tablet Take 50 mg by mouth daily.  . pantoprazole (PROTONIX) 20 MG tablet Take 20 mg by mouth daily.  . potassium chloride SA (KLOR-CON M20) 20 MEQ tablet Take 1 tablet (20 mEq total) by mouth 2 (two) times daily.  . predniSONE (DELTASONE) 5 MG tablet Take 5 mg by mouth daily.  . SYMBICORT 160-4.5 MCG/ACT inhaler Inhale 2 puffs into the lungs 2 (two) times daily.   . Tocilizumab (ACTEMRA) 162 MG/0.9ML SOSY once a week.    Current Facility-Administered Medications for the 10/03/20 encounter (Office Visit) with Berniece Salines, DO  Medication  . 0.9 %  sodium chloride infusion     Allergies:   Hydrocodone and Magnesium-containing compounds   Social History   Socioeconomic History  . Marital status: Married    Spouse name: Not on file  . Number of children: Not on file  . Years of education: Not on file  . Highest education level: Not on file  Occupational History  . Not on file  Tobacco Use  . Smoking status: Never Smoker  . Smokeless tobacco: Never Used  Substance and Sexual Activity  . Alcohol use: Not Currently  . Drug use: Never  . Sexual activity: Not on file  Other Topics Concern  . Not on file  Social History Narrative  . Not on file   Social Determinants of Health   Financial Resource Strain: Not on file  Food Insecurity: Not on file  Transportation Needs: Not on file  Physical Activity: Not on file  Stress: Not on file  Social Connections: Not on file     Family History: The patient's family history includes Breast cancer in her sister; Colon cancer in her father; Colon polyps in her sister; Heart attack in her sister and sister. There is no history of Esophageal cancer, Rectal  cancer, or Stomach cancer.  ROS:   Review of Systems  Constitution: Negative for decreased appetite, fever and weight gain.  HENT: Negative for congestion, ear discharge, hoarse voice and sore throat.   Eyes: Negative for discharge, redness, vision loss in right eye and visual halos.  Cardiovascular: Negative for chest pain, dyspnea on exertion, leg swelling, orthopnea and palpitations.  Respiratory: Negative for cough, hemoptysis, shortness of breath and snoring.   Endocrine: Negative for heat intolerance and polyphagia.  Hematologic/Lymphatic:  Negative for bleeding problem. Does not bruise/bleed easily.  Skin: Negative for flushing, nail changes, rash and suspicious lesions.  Musculoskeletal: Negative for arthritis, joint pain, muscle cramps, myalgias, neck pain and stiffness.  Gastrointestinal: Negative for abdominal pain, bowel incontinence, diarrhea and excessive appetite.  Genitourinary: Negative for decreased libido, genital sores and incomplete emptying.  Neurological: Negative for brief paralysis, focal weakness, headaches and loss of balance.  Psychiatric/Behavioral: Negative for altered mental status, depression and suicidal ideas.  Allergic/Immunologic: Negative for HIV exposure and persistent infections.    EKGs/Labs/Other Studies Reviewed:    The following studies were reviewed today:   EKG: None today  TTE Impression 10/25/2019 1. Left ventricular ejection fraction, by estimation, is 50 to 55%. The  left ventricle has low normal function on limited apical views only. Left ventricular endocardial border not optimally defined to evaluate regional wall motion. Left ventricular  diastolic parameters are consistent with Grade I diastolic dysfunction (impaired relaxation).  2. Right ventricular systolic function was not well visualized. The right ventricular size is not well visualized.  3. The mitral valve was not well visualized. No evidence of mitral valve regurgitation.   4. The aortic valve was not well visualized. Aortic valve regurgitation is not visualized. No aortic stenosis is present.  5. The inferior vena cava is normal in size with greater than 50% respiratory variability, suggesting right atrial pressure of 3 mmHg.  Pharmacologic stress test 10/25/2019  Nuclear stress EF: 57%. The left ventricular ejection fraction is normal (55-65%).  There was no ST segment deviation noted during stress.  Defect 1: There is a small defect of moderate severity present in the mid inferior, apical inferior and apex location. This is most c/w breast and diaphragmatic attenuation. The apex and apical inferior walls contract normally .  This is a low risk study. No evidence of ischemia or previous infarction The study is normal.   Recent Labs: 02/26/2020: NT-Pro BNP 102 04/04/2020: BUN 25; Creatinine, Ser 1.69; Magnesium 1.6; Potassium 4.3; Sodium 138  Recent Lipid Panel No results found for: CHOL, TRIG, HDL, CHOLHDL, VLDL, LDLCALC, LDLDIRECT  Physical Exam:    VS:  BP 130/78   Pulse 78   Ht 5\' 4"  (1.626 m)   Wt 271 lb 3.2 oz (123 kg)   SpO2 95%   BMI 46.55 kg/m     Wt Readings from Last 3 Encounters:  10/03/20 271 lb 3.2 oz (123 kg)  04/04/20 276 lb 9.6 oz (125.5 kg)  03/14/20 284 lb 3.2 oz (128.9 kg)     GEN: Well nourished, well developed in no acute distress HEENT: Normal NECK: No JVD; No carotid bruits LYMPHATICS: No lymphadenopathy CARDIAC: S1S2 noted,RRR, no murmurs, rubs, gallops RESPIRATORY:  Clear to auscultation without rales, wheezing or rhonchi  ABDOMEN: Soft, non-tender, non-distended, +bowel sounds, no guarding. EXTREMITIES: No edema, No cyanosis, no clubbing MUSCULOSKELETAL:  No deformity  SKIN: Warm and dry NEUROLOGIC:  Alert and oriented x 3, non-focal PSYCHIATRIC:  Normal affect, good insight  ASSESSMENT:    1. Essential hypertension   2. Chronic diastolic heart failure ([place])   3. Obstructive sleep apnea syndrome   4.  Mixed hyperlipidemia   5. Morbid obesity ([place])    PLAN:     Her blood pressure is acceptable in the office no changes will be made to her antihypertensive medication.  She is euvolemic she is responding well to the Bumex.  We will get blood work to assess her kidney function today.  Continue with CPAP.  Hyperlipidemia - continue with current statin medication.  The patient understands the need to lose weight with diet and exercise. We have discussed specific strategies for this.  We talked again today about question of atrial fibrillation.  The patient tells me that this had been over 30 years when she was told this.  But she has not had any other times where she was told that she had atrial fibrillation.  All of her EKGs here had not shown any atrial fibrillation.     The patient is in agreement with the above plan. The patient left the office in stable condition.  The patient will follow up in 1 year or sooner if needed.   Medication Adjustments/Labs and Tests Ordered: Current medicines are reviewed at length with the patient today.  Concerns regarding medicines are outlined above.  No orders of the defined types were placed in this encounter.  No orders of the defined types were placed in this encounter.   Patient Instructions  Medication Instructions:  Your physician recommends that you continue on your current medications as directed. Please refer to the Current Medication list given to you today.  *If you need a refill on your cardiac medications before your next appointment, please call your pharmacy*   Lab Work: Your physician has recommended you make the following change in your medication: TODAY: BMET, Mag If you have labs (blood work) drawn today and your tests are completely normal, you will receive your results only by: Marland Kitchen MyChart Message (if you have MyChart) OR . A paper copy in the mail If you have any lab test that is abnormal or we need to change your  treatment, we will call you to review the results.   Testing/Procedures: None   Follow-Up: At [facility], you and your health needs are our priority.  As part of our continuing mission to provide you with exceptional heart care, we have created designated Provider Care Teams.  These Care Teams include your primary Cardiologist (physician) and Advanced Practice Providers (APPs -  Physician Assistants and Nurse Practitioners) who all work together to provide you with the care you need, when you need it.  We recommend signing up for the patient portal called "MyChart".  Sign up information is provided on this After Visit Summary.  MyChart is used to connect with patients for Virtual Visits (Telemedicine).  Patients are able to view lab/test results, encounter notes, upcoming appointments, etc.  Non-urgent messages can be sent to your provider as well.   To learn more about what you can do with MyChart, go to [email or web address].    Your next appointment:   1 year(s)  The format for your next appointment:   In Person  Provider:   Berniece Salines, DO   Other Instructions      Adopting a Healthy Lifestyle.  Know what a healthy weight is for you (roughly BMI <25) and aim to maintain this   Aim for 7+ servings of fruits and vegetables daily   65-80+ fluid ounces of water or unsweet tea for healthy kidneys   Limit to max 1 drink of alcohol per day; avoid smoking/tobacco   Limit animal fats in diet for cholesterol and heart health - choose grass fed whenever available   Avoid highly processed foods, and foods high in saturated/trans fats   Aim for low stress - take time to unwind and care for your mental health   Aim for 150 min of moderate intensity exercise weekly for heart health,  and weights twice weekly for bone health   Aim for 7-9 hours of sleep daily   When it comes to diets, agreement about the perfect plan isnt easy to find, even among the experts. Experts at the  [place] developed an idea known as the Healthy Eating Plate. Just imagine a plate divided into logical, healthy portions.   The emphasis is on diet quality:   Load up on vegetables and fruits - one-half of your plate: Aim for color and variety, and remember that potatoes dont count.   Go for whole grains - one-quarter of your plate: Whole wheat, barley, wheat berries, quinoa, oats, brown rice, and foods made with them. If you want pasta, go with whole wheat pasta.   Protein power - one-quarter of your plate: Fish, chicken, beans, and nuts are all healthy, versatile protein sources. Limit red meat.   The diet, however, does go beyond the plate, offering a few other suggestions.   Use healthy plant oils, such as olive, canola, soy, corn, sunflower and peanut. Check the labels, and avoid partially hydrogenated oil, which have unhealthy trans fats.   If youre thirsty, drink water. Coffee and tea are good in moderation, but skip sugary drinks and limit milk and dairy products to one or two daily servings.   The type of carbohydrate in the diet is more important than the amount. Some sources of carbohydrates, such as vegetables, fruits, whole grains, and beans-are healthier than others.   Finally, stay active  Signed, Berniece Salines, DO  10/03/2020 11:40 AM    [place]

## 2020-10-03 NOTE — Patient Instructions (Signed)
Medication Instructions:  Your physician recommends that you continue on your current medications as directed. Please refer to the Current Medication list given to you today.  *If you need a refill on your cardiac medications before your next appointment, please call your pharmacy*   Lab Work: Your physician has recommended you make the following change in your medication: TODAY: BMET, Mag If you have labs (blood work) drawn today and your tests are completely normal, you will receive your results only by: Marland Kitchen MyChart Message (if you have MyChart) OR . A paper copy in the mail If you have any lab test that is abnormal or we need to change your treatment, we will call you to review the results.   Testing/Procedures: None   Follow-Up: At [facility], you and your health needs are our priority.  As part of our continuing mission to provide you with exceptional heart care, we have created designated Provider Care Teams.  These Care Teams include your primary Cardiologist (physician) and Advanced Practice Providers (APPs -  Physician Assistants and Nurse Practitioners) who all work together to provide you with the care you need, when you need it.  We recommend signing up for the patient portal called "MyChart".  Sign up information is provided on this After Visit Summary.  MyChart is used to connect with patients for Virtual Visits (Telemedicine).  Patients are able to view lab/test results, encounter notes, upcoming appointments, etc.  Non-urgent messages can be sent to your provider as well.   To learn more about what you can do with MyChart, go to [email or web address].    Your next appointment:   1 year(s)  The format for your next appointment:   In Person  Provider:   Berniece Salines, DO   Other Instructions

## 2020-10-04 LAB — BASIC METABOLIC PANEL
BUN/Creatinine Ratio: 24 (ref 12–28)
BUN: 33 mg/dL — ABNORMAL HIGH (ref 8–27)
CO2: 22 mmol/L (ref 20–29)
Calcium: 10.5 mg/dL — ABNORMAL HIGH (ref 8.7–10.3)
Chloride: 99 mmol/L (ref 96–106)
Creatinine, Ser: 1.36 mg/dL — ABNORMAL HIGH (ref 0.57–1.00)
Glucose: 81 mg/dL (ref 65–99)
Potassium: 5.1 mmol/L (ref 3.5–5.2)
Sodium: 141 mmol/L (ref 134–144)
eGFR: 43 mL/min/{1.73_m2} — ABNORMAL LOW (ref >59)

## 2020-10-04 LAB — MAGNESIUM: Magnesium: 2 mg/dL (ref 1.6–2.3)

## 2020-12-02 ENCOUNTER — Other Ambulatory Visit: Payer: Self-pay

## 2020-12-02 MED ORDER — POTASSIUM CHLORIDE CRYS ER 20 MEQ PO TBCR: 20.0000 meq | EXTENDED_RELEASE_TABLET | Freq: Two times a day (BID) | ORAL | 3 refills | Status: DC

## 2020-12-02 NOTE — Telephone Encounter (Signed)
Potassium CL ER 20 meq # 180 x 3 refills sent to mail delivery pharmacy

## 2021-01-04 IMAGING — MG MM BREAST BX W LOC DEV 1ST LESION IMAGE BX SPEC STEREO GUIDE*L*
8 of 16 series · 8 of 36 positions shown · non-contrast
Comparison: Previous exams.
COMPARISON: Previous exams.

Addendum:
CLINICAL DATA: Developing asymmetry in the upper outer left breast.

EXAM:
LEFT BREAST STEREOTACTIC CORE NEEDLE BIOPSY

[L (1 of 8)]
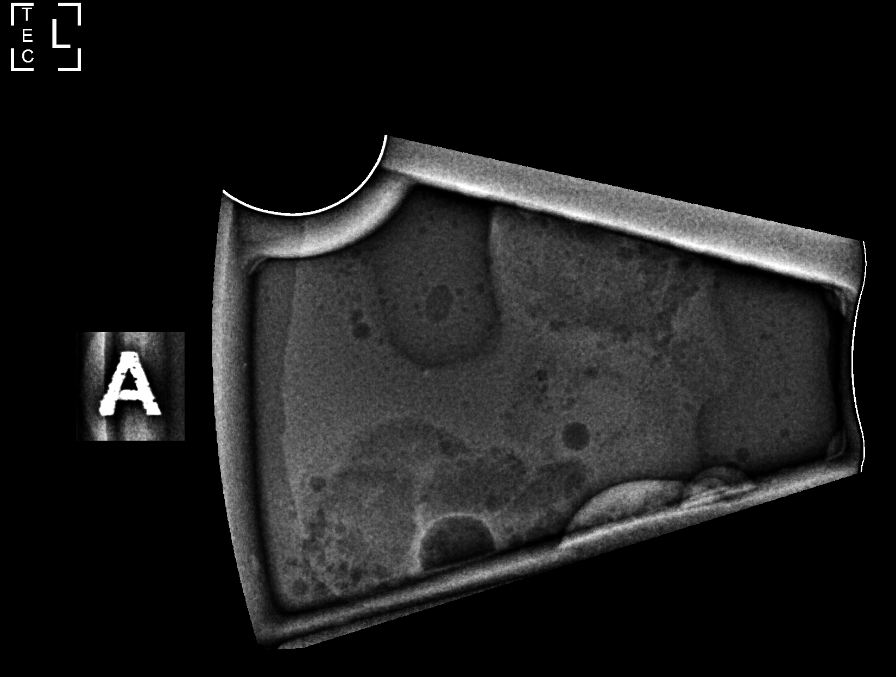

[L (2 of 8)]
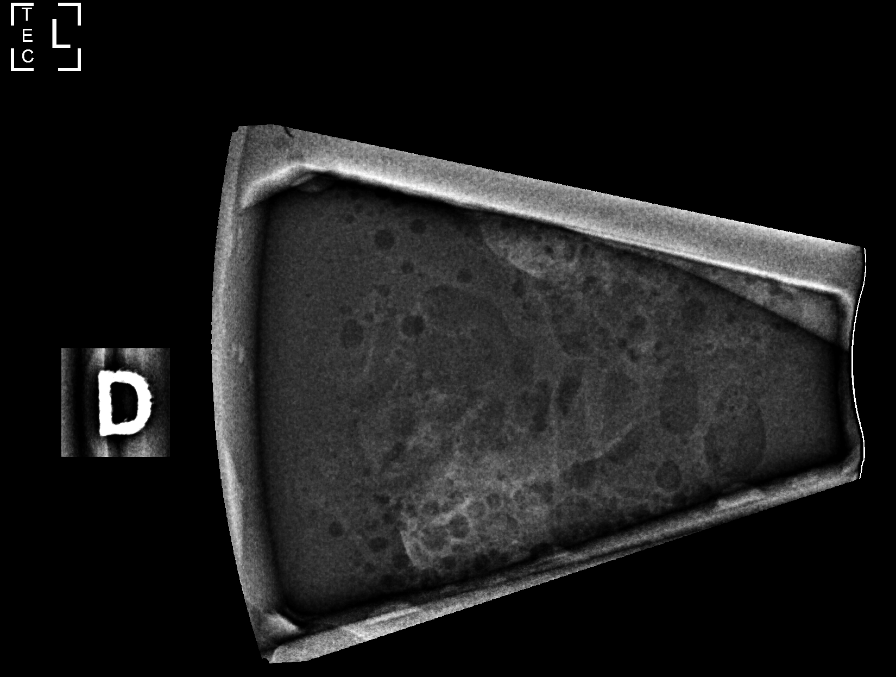

[L (3 of 8)]
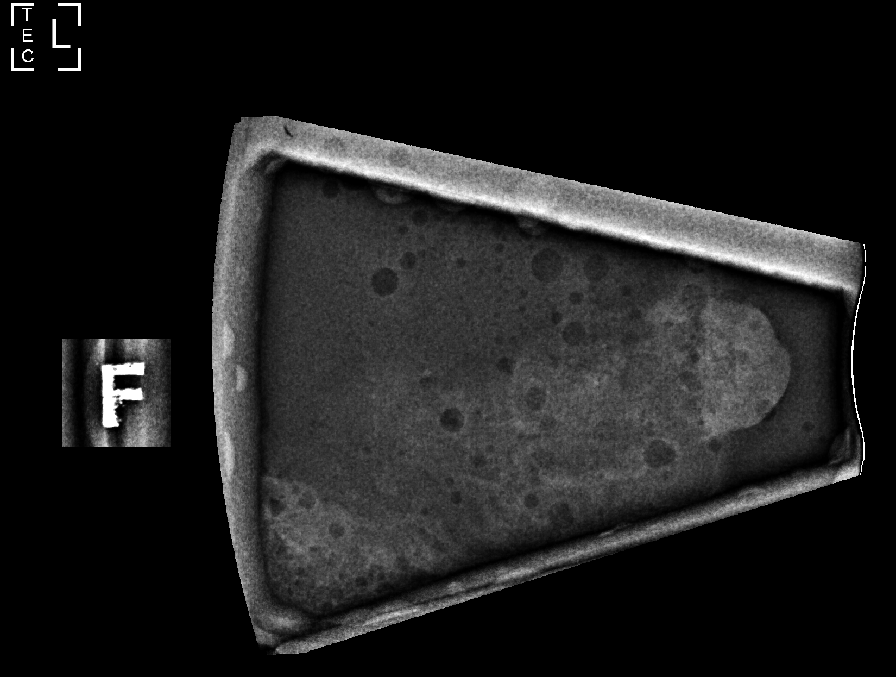

[L (4 of 8)]
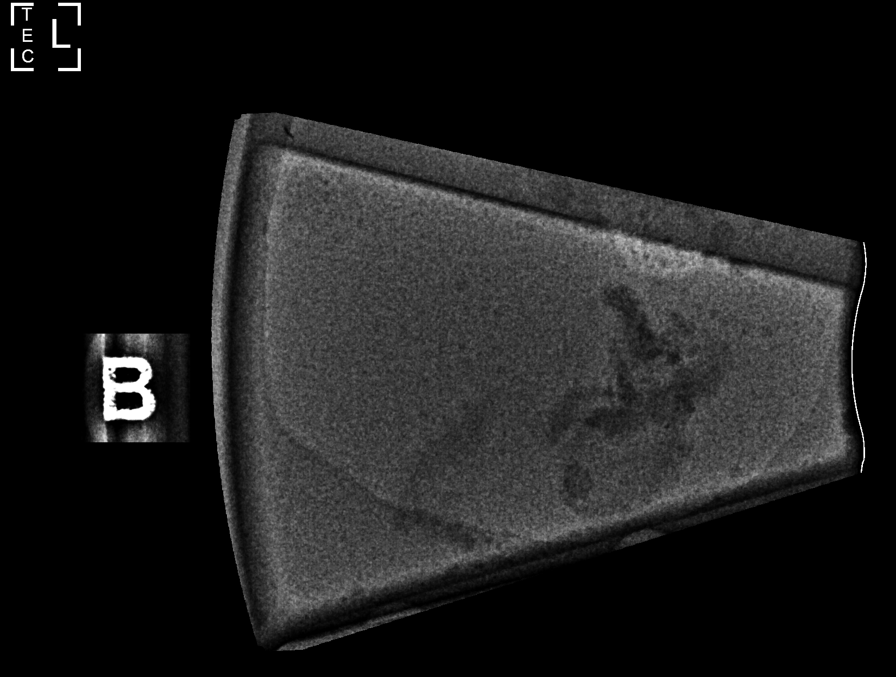

[L (5 of 8)]
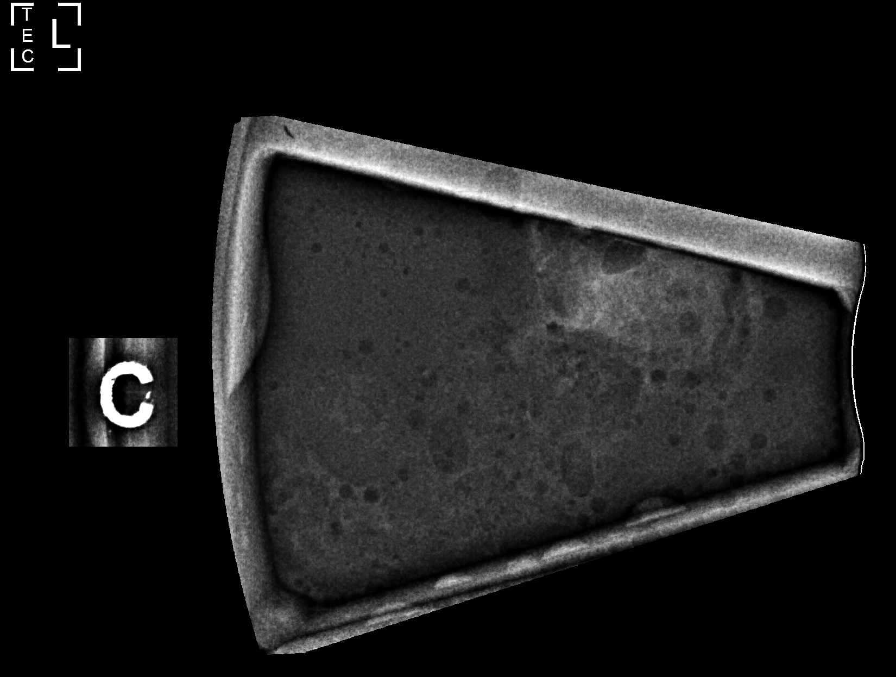

[L (6 of 8)]
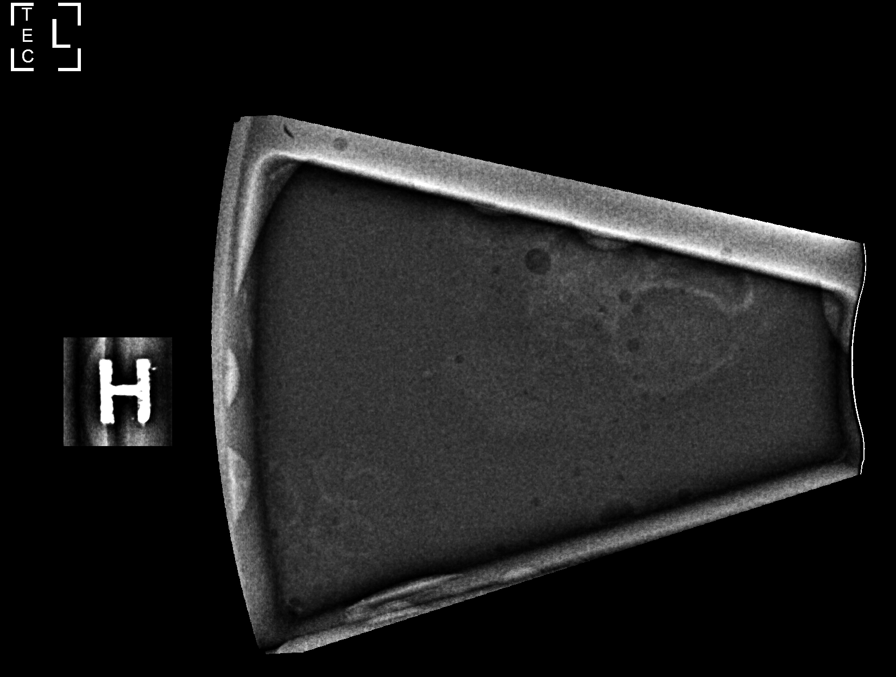

[L (7 of 8)]
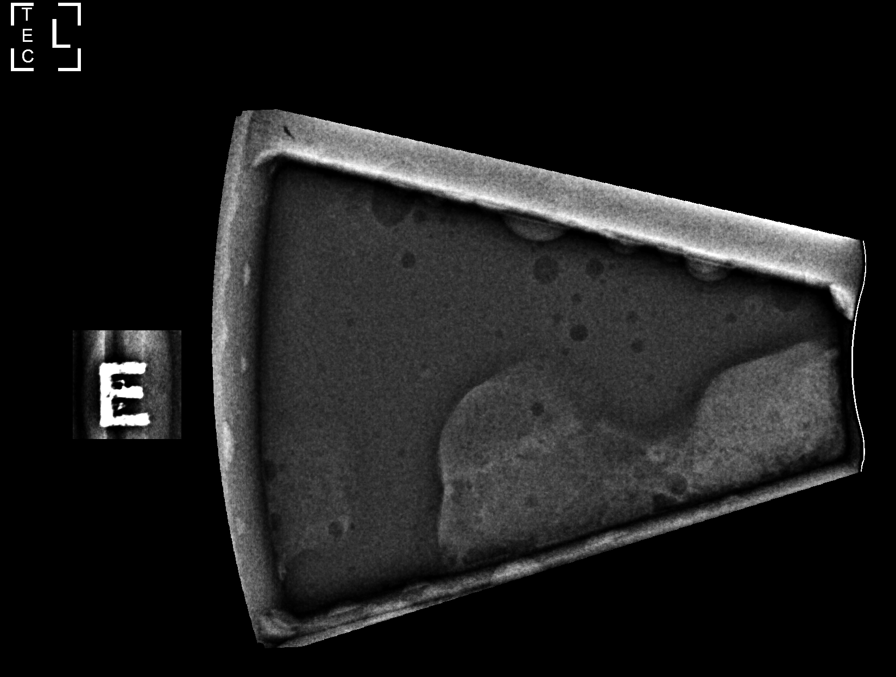

[L (8 of 8)]
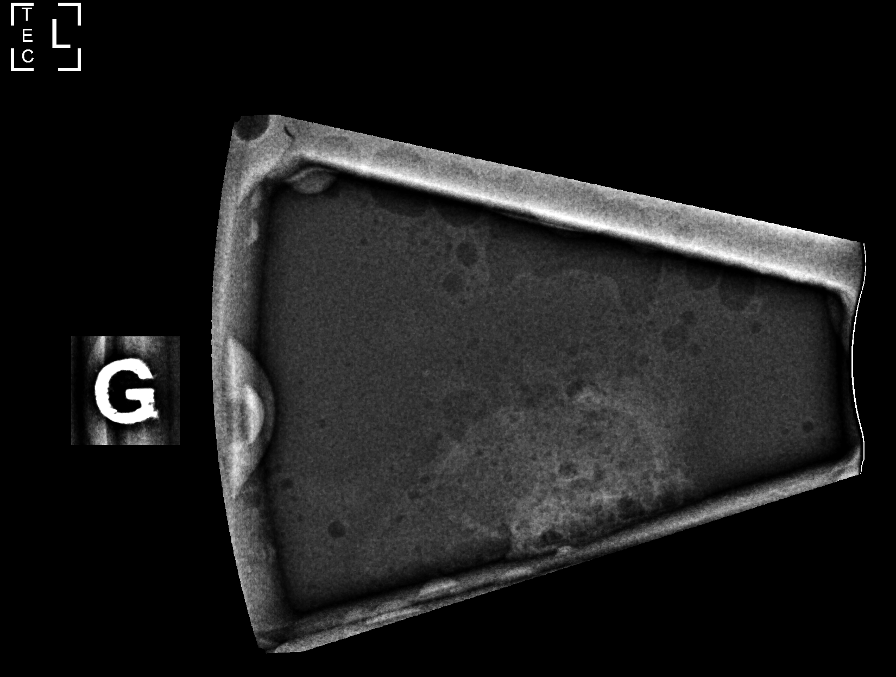

[8 of 36 positions shown; findings below may reference images not displayed]



Using sterile technique and 1% Lidocaine as local anesthetic, under
stereotactic guidance, a 9 gauge vacuum assisted device was used to
perform core needle biopsy of the developing asymmetry in the upper
outer left breast using a superior approach. Specimen radiograph was
performed showing representative tissue. Specimens with
calcifications are identified for pathology.

Lesion quadrant: Upper outer quadrant

At the conclusion of the procedure, coil shaped tissue marker clip
was deployed into the biopsy cavity. Follow-up 2-view mammogram was
performed and dictated separately.
IMPRESSION: Stereotactic-guided biopsy of a developing asymmetry in the left
breast. No apparent complications.

ADDENDUM:
Pathology revealed PSEUDOANGIOMATOUS STROMAL HYPERPLASIA (PASH).
FIBROCYSTIC CHANGE of the LEFT breast, upper outer quadrant. This
was found to be concordant by Dr. Jiggaman Thierauch.

Pathology results were discussed with the patient by telephone. The
patient reported doing well after the biopsy with tenderness at the
site. Post biopsy instructions and care were reviewed and questions
were answered. The patient was encouraged to call The [REDACTED]

The patient was instructed to return for annual screening
mammography at [facility] in [HOSPITAL].

Pathology results reported by Mmabila Opambour, RN on 04/06/2019.



Using sterile technique and 1% Lidocaine as local anesthetic, under
stereotactic guidance, a 9 gauge vacuum assisted device was used to
perform core needle biopsy of the developing asymmetry in the upper
outer left breast using a superior approach. Specimen radiograph was
performed showing representative tissue. Specimens with
calcifications are identified for pathology.

Lesion quadrant: Upper outer quadrant

At the conclusion of the procedure, coil shaped tissue marker clip
was deployed into the biopsy cavity. Follow-up 2-view mammogram was
performed and dictated separately.
IMPRESSION: Stereotactic-guided biopsy of a developing asymmetry in the left
breast. No apparent complications.

## 2021-02-11 ENCOUNTER — Telehealth: Payer: Self-pay | Admitting: Cardiology

## 2021-02-11 NOTE — Telephone Encounter (Signed)
Pt c/o Shortness Of Breath: STAT if SOB developed within the last 24 hours or pt is noticeably SOB on the phone  1. Are you currently SOB (can you hear that pt is SOB on the phone)? Yes, cannot hear over the phone   2. How long have you been experiencing SOB? Going on a little over a week   3. Are you SOB when sitting or when up moving around? Moving around mostly   4. Are you currently experiencing any other symptoms? Hypotension, feels heaviness in lungs when walking.   Wants an appt this week to be seen in regards to it.

## 2021-02-11 NOTE — Telephone Encounter (Signed)
Per Dr. Bettina Gavia: I do not see this is a clinical problem that can be solved over the phone I would advise her to go the emergency room with the severity of her symptoms she is obviously a marked decompensated heart failure or is having a severe pulmonary problem.   Patient advised of the following and will head out to the ED.

## 2021-02-12 DIAGNOSIS — E785 Hyperlipidemia, unspecified: Secondary | ICD-10-CM

## 2021-02-12 DIAGNOSIS — I10 Essential (primary) hypertension: Secondary | ICD-10-CM

## 2021-02-12 DIAGNOSIS — R079 Chest pain, unspecified: Secondary | ICD-10-CM

## 2021-02-12 DIAGNOSIS — G4733 Obstructive sleep apnea (adult) (pediatric): Secondary | ICD-10-CM

## 2021-02-13 DIAGNOSIS — R079 Chest pain, unspecified: Secondary | ICD-10-CM | POA: Diagnosis not present

## 2021-02-13 DIAGNOSIS — I10 Essential (primary) hypertension: Secondary | ICD-10-CM | POA: Diagnosis not present

## 2021-02-13 DIAGNOSIS — E785 Hyperlipidemia, unspecified: Secondary | ICD-10-CM | POA: Diagnosis not present

## 2021-02-17 ENCOUNTER — Telehealth: Payer: Self-pay | Admitting: Oncology

## 2021-02-17 NOTE — Telephone Encounter (Signed)
Patient referred by Dr Gala Lewandowsky for Low Ferritin.  Appt made 03/09/21 Labs 2:45 pm - Consult 3:15 pm

## 2021-03-08 ENCOUNTER — Other Ambulatory Visit: Payer: Self-pay | Admitting: Oncology

## 2021-03-08 DIAGNOSIS — R7989 Other specified abnormal findings of blood chemistry: Secondary | ICD-10-CM

## 2021-03-08 NOTE — Progress Notes (Signed)
Stefanie Campbell  [address] [place],  [place]  [address] [phone]  Clinic Day:  03/09/2021  Referring physician: Gala Lewandowsky, MD   HISTORY OF PRESENT ILLNESS:  The patient is a 65 y.o. female who I was asked to consult upon for anemia in the setting of an elevated ferritin.  Recent labs showed a low hemoglobin of 10.6, but a very elevated ferritin of 1295.  Her other iron parameters were normal.   The patient has had issues with anemia dating back a decade.  In the past IV iron was given to improve her anemia.  She denies having any overt forms of blood to explain her anemia.  She claims to have had a colonoscopy less than 5 years ago, which did not show any adverse lower GI tract pathology.  She is not taking any oral iron.  There are numerous family members who also have anemia,  including her mother, son, grandchild and sister.    PAST MEDICAL HISTORY:   Past Medical History:  Diagnosis Date   Aftercare following surgery 11/15/2017   Allergy    Anemia    Arthritis    Bilateral leg edema 11/15/2019   Blood transfusion without reported diagnosis    allergic and had to stop 15 minutes in    Chest pain 10/06/2019   Chronic pain of right ankle 07/12/2017   Chronic venous insufficiency 07/12/2017   Class 3 severe obesity in adult [facility]) 11/24/2018   Current use of steroid medication 01/26/2019   Essential hypertension 10/06/2019   GERD (gastroesophageal reflux disease)    Giant cell arteritis ([place]) 11/24/2018   Graves disease    Hyperlipidemia    Hypertension    Liver disease    Low back pain 03/12/2013   Low back strain 02/16/2016   Mixed hyperlipidemia 10/06/2019   Morbid obesity ([place]) 02/26/2020   Osteoarthritis of hip 06/05/2018   Other headache syndrome 01/26/2019   PAF (paroxysmal atrial fibrillation) ([place]) 04/13/2015   Formatting of this note might be different from the original. CHADS2 vasc score= 2 Formatting of this note might be different from  the original. Overview:  CHADS2 vasc score= 2   Pain in unspecified knee 03/12/2013   Palpitations 10/06/2019   Primary osteoarthritis of one hip, right 06/05/2018   Sleep apnea    wears cpap    Stroke [facility])    mini stroke per pt    Syncope 04/13/2015   Tendonitis of ankle, right 07/12/2017  Temporal arteritis Pulmonary emboli  PAST SURGICAL HISTORY:   Past Surgical History:  Procedure Laterality Date   ABDOMINAL HYSTERECTOMY     BREAST SURGERY     benign cyst   CARPAL TUNNEL RELEASE Right    CESAREAN SECTION     x2   COLONOSCOPY     FOOT SURGERY     on toe    KNEE ARTHROSCOPY Left    temperol biopsy     TOTAL HIP ARTHROPLASTY Right     CURRENT MEDICATIONS:   Current Outpatient Medications  Medication Sig Dispense Refill   albuterol (VENTOLIN HFA) 108 (90 Base) MCG/ACT inhaler albuterol sulfate HFA 90 mcg/actuation aerosol inhaler  TAKE 2 PUFFS BY MOUTH EVERY 4 TO 6 HOURS AS NEEDED     alendronate (FOSAMAX) 70 MG tablet Fosamax 70 mg tablet  Take 1 tablet every week by oral route before meals.     amLODipine (NORVASC) 5 MG tablet TAKE 1/2 TABLET BY MOUTH DAILY 45 tablet 4  aspirin EC 81 MG tablet Take 81 mg by mouth daily.      bumetanide (BUMEX) 2 MG tablet Take 0.5 tablets (1 mg total) by mouth daily. 90 tablet 3   Calcium Carbonate (CALCIUM 600 PO) Take 1 tablet by mouth daily.      cetirizine (ZYRTEC) 10 MG tablet Take 10 mg by mouth as needed.      Cholecalciferol (VITAMIN D) 50 MCG (2000 UT) CAPS Take 1 capsule by mouth daily.     lisinopril (ZESTRIL) 40 MG tablet Take 40 mg by mouth daily.     lovastatin (MEVACOR) 40 MG tablet Take 40 mg by mouth daily.     metFORMIN (GLUCOPHAGE) 500 MG tablet Take 500 mg by mouth daily.     methocarbamol (ROBAXIN) 750 MG tablet Take 750 mg by mouth daily as needed.      metoprolol succinate (TOPROL-XL) 50 MG 24 hr tablet Take 50 mg by mouth daily.     pantoprazole (PROTONIX) 20 MG tablet Take 20 mg by mouth daily.      potassium chloride SA (KLOR-CON M20) 20 MEQ tablet Take 1 tablet (20 mEq total) by mouth 2 (two) times daily. 180 tablet 3   predniSONE (DELTASONE) 5 MG tablet Take 5 mg by mouth daily.     SYMBICORT 160-4.5 MCG/ACT inhaler Inhale 2 puffs into the lungs 2 (two) times daily.      Tocilizumab (ACTEMRA) 162 MG/0.9ML SOSY once a week.      Current Facility-Administered Medications  Medication Dose Route Frequency Provider Last Rate Last Admin   0.9 %  sodium chloride infusion  500 mL Intravenous Once Jackquline Denmark, MD        ALLERGIES:   Allergies  Allergen Reactions   Hydrocodone Shortness Of Breath    Pt states she has taken percocet with no issues and believes she has taken dilaudid but is not positive    Magnesium-Containing Compounds Anaphylaxis    Has been in hospital with last 2 colon preps     FAMILY HISTORY:   Family History  Problem Relation Age of Onset   Colon cancer Father        in his late 77's   Colon polyps Sister    Heart attack Sister    Breast cancer Sister    Heart attack Sister    Esophageal cancer Neg Hx    Rectal cancer Neg Hx    Stomach cancer Neg Hx   Mother died from end-stage renal disease  SOCIAL HISTORY:  The patient was born in [place].  She lives in town with her husband of 16 years.  She has 2 children, 9 grandchildren and 1 great-grandchild.  She worked for an [employer] for 26 years .  There is no history of alcohol or tobacco abuse.    REVIEW OF SYSTEMS:  Review of Systems  Constitutional:  Negative for fatigue and fever.  HENT:   Negative for hearing loss and sore throat.   Eyes:  Positive for eye problems (blurry vision).  Respiratory:  Negative for chest tightness, cough and hemoptysis.   Cardiovascular:  Positive for palpitations. Negative for chest pain.  Gastrointestinal:  Negative for abdominal distention, abdominal pain, blood in stool, constipation, diarrhea, nausea and vomiting.  Endocrine: Negative for hot flashes.   Genitourinary:  Negative for difficulty urinating, dysuria, frequency, hematuria and nocturia.   Musculoskeletal:  Positive for back pain. Negative for arthralgias, gait problem and myalgias.       Left knee  pain  Skin: Negative.  Negative for itching and rash.  Neurological: Negative.  Negative for dizziness, extremity weakness, gait problem, headaches, light-headedness and numbness.  Hematological: Negative.   Psychiatric/Behavioral: Negative.  Negative for depression and suicidal ideas. The patient is not nervous/anxious.     PHYSICAL EXAM:  Blood pressure (!) 170/78, pulse 93, temperature 98.4 F (36.9 C), resp. rate 16, height '5\' 4"'$  (1.626 m), weight 258 lb 9.6 oz (117.3 kg), SpO2 96 %. Wt Readings from Last 3 Encounters:  03/09/21 258 lb 9.6 oz (117.3 kg)  10/03/20 271 lb 3.2 oz (123 kg)  04/04/20 276 lb 9.6 oz (125.5 kg)   Body mass index is 44.39 kg/m. Performance status (ECOG): 1 - Symptomatic but completely ambulatory Physical Exam Constitutional:      Appearance: Normal appearance. She is not ill-appearing.  HENT:     Mouth/Throat:     Mouth: Mucous membranes are moist.     Pharynx: Oropharynx is clear. No oropharyngeal exudate or posterior oropharyngeal erythema.  Cardiovascular:     Rate and Rhythm: Normal rate and regular rhythm.     Heart sounds: No murmur heard.   No friction rub. No gallop.  Pulmonary:     Effort: Pulmonary effort is normal. No respiratory distress.     Breath sounds: Normal breath sounds. No wheezing, rhonchi or rales.  Abdominal:     General: Bowel sounds are normal. There is no distension.     Palpations: Abdomen is soft. There is no mass.     Tenderness: There is no abdominal tenderness.  Musculoskeletal:        General: No swelling.     Right lower leg: No edema.     Left lower leg: No edema.  Lymphadenopathy:     Cervical: No cervical adenopathy.     Upper Body:     Right upper body: No supraclavicular or axillary adenopathy.      Left upper body: No supraclavicular or axillary adenopathy.     Lower Body: No right inguinal adenopathy. No left inguinal adenopathy.  Skin:    General: Skin is warm.     Coloration: Skin is not jaundiced.     Findings: No lesion or rash.  Neurological:     General: No focal deficit present.     Mental Status: She is alert and oriented to person, place, and time. Mental status is at baseline.     Cranial Nerves: Cranial nerves are intact.  Psychiatric:        Mood and Affect: Mood normal.        Behavior: Behavior normal.        Thought Content: Thought content normal.   LABS:   CBC Latest Ref Rng & Units 03/09/2021  WBC - 8.4  Hemoglobin 12.0 - 16.0 8.6(A)  Hematocrit 36 - 46 28(A)  Platelets 150 - 399 376   CMP Latest Ref Rng & Units 03/09/2021 10/03/2020 04/04/2020  Glucose 65 - 99 mg/dL - 81 134(H)  BUN 4 - 21 29(A) 33(H) 25  Creatinine 0.5 - 1.1 1.5(A) 1.36(H) 1.69(H)  Sodium 137 - 147 141 141 138  Potassium 3.4 - 5.3 3.9 5.1 4.3  Chloride 99 - 108 104 99 102  CO2 13 - '22 21 22 25  '$ Calcium 8.7 - 10.7 9.2 10.5(H) 10.5(H)  Alkaline Phos 25 - 125 80 - -  AST 13 - 35 45(A) - -  ALT 7 - 35 53(A) - -    ASSESSMENT & PLAN:  A 65 y.o. female who I was asked to consult upon for anemia in the setting of an elevated ferritin.  I definitely believe her elevated ferritin is a reflection of it being an acute phase reactant, likely from her recent issues with temporal arteritis.  Her anemia has progressively declined recently.  I will check her iron, B12, and folate levels to ensure no nutritional deficiencies are present.  She does have a component of renal insufficiency which likely is factoring into her anemia.  I will check an SPEP to ensure an underlying plasma cell dyscrasia is not present.   I will see her back in 2 weeks to go over all of her labs and their implications.  The patient understands all the plans discussed today and is in agreement with them.  I do appreciate  Sistasis, Rowena, MD for his new consult.   Rebecka Oelkers Macarthur Critchley, MD

## 2021-03-09 ENCOUNTER — Encounter: Payer: Self-pay | Admitting: Oncology

## 2021-03-09 ENCOUNTER — Telehealth: Payer: Self-pay | Admitting: Oncology

## 2021-03-09 ENCOUNTER — Other Ambulatory Visit: Payer: Self-pay | Admitting: Oncology

## 2021-03-09 ENCOUNTER — Inpatient Hospital Stay: Payer: Medicare HMO | Admitting: Oncology

## 2021-03-09 ENCOUNTER — Inpatient Hospital Stay: Payer: Medicare HMO | Attending: Oncology

## 2021-03-09 ENCOUNTER — Other Ambulatory Visit: Payer: Self-pay | Admitting: Hematology and Oncology

## 2021-03-09 VITALS — BP 170/78 | HR 93 | Temp 98.4°F | Resp 16 | Ht 64.0 in | Wt 258.6 lb

## 2021-03-09 DIAGNOSIS — D649 Anemia, unspecified: Secondary | ICD-10-CM

## 2021-03-09 DIAGNOSIS — D509 Iron deficiency anemia, unspecified: Secondary | ICD-10-CM

## 2021-03-09 DIAGNOSIS — R7989 Other specified abnormal findings of blood chemistry: Secondary | ICD-10-CM

## 2021-03-09 DIAGNOSIS — I10 Essential (primary) hypertension: Secondary | ICD-10-CM | POA: Diagnosis not present

## 2021-03-09 DIAGNOSIS — Z808 Family history of malignant neoplasm of other organs or systems: Secondary | ICD-10-CM

## 2021-03-09 LAB — CBC AND DIFFERENTIAL
HCT: 28 — AB (ref 36–46)
Hemoglobin: 8.6 — AB (ref 12.0–16.0)
Neutrophils Absolute: 6.97
Platelets: 376 (ref 150–399)
WBC: 8.4

## 2021-03-09 LAB — BASIC METABOLIC PANEL
BUN: 29 — AB (ref 4–21)
CO2: 21 (ref 13–22)
Chloride: 104 (ref 99–108)
Creatinine: 1.5 — AB (ref 0.5–1.1)
Glucose: 157
Potassium: 3.9 (ref 3.4–5.3)
Sodium: 141 (ref 137–147)

## 2021-03-09 LAB — HEPATIC FUNCTION PANEL
ALT: 53 — AB (ref 7–35)
AST: 45 — AB (ref 13–35)
Alkaline Phosphatase: 80 (ref 25–125)
Bilirubin, Total: 0.4

## 2021-03-09 LAB — FOLATE: Folate: 15.1 ng/mL (ref >5.9)

## 2021-03-09 LAB — CBC
MCV: 92 (ref 81–99)
RBC: 3 — AB (ref 3.87–5.11)

## 2021-03-09 LAB — COMPREHENSIVE METABOLIC PANEL
Albumin: 4.1 (ref 3.5–5.0)
Calcium: 9.2 (ref 8.7–10.7)

## 2021-03-09 LAB — IRON AND TIBC
Iron: 55 ug/dL (ref 28–170)
Saturation Ratios: 14 % (ref 10.4–31.8)
TIBC: 394 ug/dL (ref 250–450)
UIBC: 339 ug/dL

## 2021-03-09 LAB — FERRITIN: Ferritin: 362 ng/mL — ABNORMAL HIGH (ref 11–307)

## 2021-03-09 LAB — VITAMIN B12: Vitamin B-12: 839 pg/mL (ref 180–914)

## 2021-03-09 NOTE — Telephone Encounter (Signed)
Per 9/19 LOS, patient scheduled for Oct Appt.  Gave patient Appt Summary

## 2021-03-10 LAB — PROTEIN ELECTROPHORESIS, SERUM
A/G Ratio: 1.3 (ref 0.7–1.7)
Albumin ELP: 3.5 g/dL (ref 2.9–4.4)
Alpha-1-Globulin: 0.2 g/dL (ref 0.0–0.4)
Alpha-2-Globulin: 0.9 g/dL (ref 0.4–1.0)
Beta Globulin: 1 g/dL (ref 0.7–1.3)
Gamma Globulin: 0.6 g/dL (ref 0.4–1.8)
Globulin, Total: 2.7 g/dL (ref 2.2–3.9)
Total Protein ELP: 6.2 g/dL (ref 6.0–8.5)

## 2021-03-10 LAB — SOLUBLE TRANSFERRIN RECEPTOR: Transferrin Receptor: 68.6 nmol/L — ABNORMAL HIGH (ref 12.2–27.3)

## 2021-03-13 LAB — HEMOCHROMATOSIS DNA-PCR(C282Y,H63D)

## 2021-03-17 NOTE — Progress Notes (Signed)
[facility]  [address] [place],  [place]  [address] [phone]  Clinic Day:  03/24/2021  Referring physician: Gala Lewandowsky, MD  This document serves as a record of services personally performed by Marice Potter, MD. It was created on their behalf by Curry,Lauren E, a trained medical scribe. The creation of this record is based on the scribe's personal observations and the provider's statements to them.  HISTORY OF PRESENT ILLNESS:  The patient is a 65 y.o. female who I recently began seeing for anemia in the setting of an elevated ferritin.  Recent labs showed a low hemoglobin of 10.6, but a very elevated ferritin of 1295.  Her other iron parameters were normal.   The patient comes in today to go over all of her recent labs and their implications.  Since her last visit, she has been doing okay.  She has fatigue, but denies having any overt forms of blood loss.  PHYSICAL EXAM:  Blood pressure (!) 158/73, pulse 80, temperature 98 F (36.7 C), temperature source Oral, resp. rate 18, height 5\' 4"  (1.626 m), weight 255 lb (115.7 kg), SpO2 92 %. Wt Readings from Last 3 Encounters:  03/24/21 255 lb (115.7 kg)  03/09/21 258 lb 9.6 oz (117.3 kg)  10/03/20 271 lb 3.2 oz (123 kg)   Body mass index is 43.77 kg/m. Performance status (ECOG): 1 - Symptomatic but completely ambulatory Physical Exam Constitutional:      Appearance: Normal appearance. She is not ill-appearing.  HENT:     Mouth/Throat:     Mouth: Mucous membranes are moist.     Pharynx: Oropharynx is clear. No oropharyngeal exudate or posterior oropharyngeal erythema.  Cardiovascular:     Rate and Rhythm: Normal rate and regular rhythm.     Heart sounds: No murmur heard.   No friction rub. No gallop.  Pulmonary:     Effort: Pulmonary effort is normal. No respiratory distress.     Breath sounds: Normal breath sounds. No wheezing, rhonchi or rales.  Abdominal:     General: Bowel  sounds are normal. There is no distension.     Palpations: Abdomen is soft. There is no mass.     Tenderness: There is no abdominal tenderness.  Musculoskeletal:        General: No swelling.     Right lower leg: No edema.     Left lower leg: No edema.  Lymphadenopathy:     Cervical: No cervical adenopathy.     Upper Body:     Right upper body: No supraclavicular or axillary adenopathy.     Left upper body: No supraclavicular or axillary adenopathy.     Lower Body: No right inguinal adenopathy. No left inguinal adenopathy.  Skin:    General: Skin is warm.     Coloration: Skin is not jaundiced.     Findings: No lesion or rash.  Neurological:     General: No focal deficit present.     Mental Status: She is alert and oriented to person, place, and time. Mental status is at baseline.     Cranial Nerves: Cranial nerves are intact.  Psychiatric:        Mood and Affect: Mood normal.        Behavior: Behavior normal.        Thought Content: Thought content normal.   LABS:   CBC Latest Ref Rng & Units 03/09/2021  WBC - 8.4  Hemoglobin 12.0 - 16.0 8.6(A)  Hematocrit  36 - 46 28(A)  Platelets 150 - 399 376   CMP Latest Ref Rng & Units 03/09/2021 10/03/2020 04/04/2020  Glucose 65 - 99 mg/dL - 81 134(H)  BUN 4 - 21 29(A) 33(H) 25  Creatinine 0.5 - 1.1 1.5(A) 1.36(H) 1.69(H)  Sodium 137 - 147 141 141 138  Potassium 3.4 - 5.3 3.9 5.1 4.3  Chloride 99 - 108 104 99 102  CO2 13 - 22 21 22 25   Calcium 8.7 - 10.7 9.2 10.5(H) 10.5(H)  Alkaline Phos 25 - 125 80 - -  AST 13 - 35 45(A) - -  ALT 7 - 35 53(A) - -   Iron 28 - 170 ug/dL 55   TIBC 250 - 450 ug/dL 394   Saturation Ratios 10.4 - 31.8 % 14   UIBC ug/dL 339    Ferritin 11 - 307 ng/mL 362 High     Transferrin Receptor 12.2 - 27.3 nmol/L 68.6 High     Vitamin B-12 180 - 914 pg/mL 839    Folate >5.9 ng/mL 15.1    Total Protein ELP 6.0 - 8.5 g/dL 6.2   Albumin ELP 2.9 - 4.4 g/dL 3.5   Alpha-1-Globulin 0.0 - 0.4 g/dL 0.2    Alpha-2-Globulin 0.4 - 1.0 g/dL 0.9   Beta Globulin 0.7 - 1.3 g/dL 1.0   Gamma Globulin 0.4 - 1.8 g/dL 0.6   M-Spike, % Not Observed g/dL Not Observed    Globulin, Total 2.2 - 3.9 g/dL 2.7 VC   A/G Ratio 0.7 - 1.7 1.3 VC    Hemochromatosis DNA-PCR(c282y,h63d) Order: [id number] Status: Final result   Visible to patient: No (inaccessible in MyChart)   Next appt: 03/24/2021 at 04:15 PM in Oncology (Dequincy Macarthur Critchley, MD)   Dx: Elevated ferritin   0 Result Notes Component 8 d ago   DNA Mutation Analysis Comment   Comment: (NOTE)  Result:  c.845G>A (p.Cys282Tyr) - Not Detected  c.187C>G (p.His63Asp) - Not Detected  c.193A>T (p.Ser65Cys) - Not Detected      ASSESSMENT & PLAN:  A 65 y.o. female with anemia in the setting of an elevated ferritin.  Based upon her very elevated soluble transferrin receptor level, I do believe she is iron deficient to where I will arrange for her to receive IV iron over the next few weeks.  Her elevated ferritin is likely a reflection of it being an acute phase reactant from her recent issues with temporal arteritis.  Unsurprisingly, testing for hemochromatosis came back negative.  I will see her back in 3 months to reassess her hemoglobin to see how well she responded to her upcoming IV iron.  The patient understands all the plans discussed today and is in agreement with them.  I, Rita Ohara, am acting as scribe for Marice Potter, MD    I have reviewed this report as typed by the medical scribe, and it is complete and accurate.  Dequincy Macarthur Critchley, MD

## 2021-03-24 ENCOUNTER — Inpatient Hospital Stay: Payer: Medicare HMO | Attending: Oncology | Admitting: Oncology

## 2021-03-24 ENCOUNTER — Telehealth: Payer: Self-pay | Admitting: Oncology

## 2021-03-24 ENCOUNTER — Encounter: Payer: Self-pay | Admitting: Oncology

## 2021-03-24 VITALS — BP 158/73 | HR 80 | Temp 98.0°F | Resp 18 | Ht 64.0 in | Wt 255.0 lb

## 2021-03-24 DIAGNOSIS — D6489 Other specified anemias: Secondary | ICD-10-CM | POA: Insufficient documentation

## 2021-03-24 DIAGNOSIS — D508 Other iron deficiency anemias: Secondary | ICD-10-CM

## 2021-03-24 DIAGNOSIS — Z23 Encounter for immunization: Secondary | ICD-10-CM | POA: Insufficient documentation

## 2021-03-24 NOTE — Telephone Encounter (Signed)
Per 10/4 LOS, patient scheduled for IV Iron Infusions - Jan 2023 Appt's.  Gave patient Appt Summary

## 2021-03-27 ENCOUNTER — Encounter: Payer: Self-pay | Admitting: Oncology

## 2021-03-27 DIAGNOSIS — D509 Iron deficiency anemia, unspecified: Secondary | ICD-10-CM | POA: Insufficient documentation

## 2021-03-27 NOTE — Progress Notes (Signed)
..  Feraheme orders changed to venofer due to insurance plan preference.  Message to scheduling has been sent.  

## 2021-04-03 ENCOUNTER — Other Ambulatory Visit: Payer: Self-pay

## 2021-04-03 ENCOUNTER — Inpatient Hospital Stay: Payer: Medicare HMO

## 2021-04-03 VITALS — BP 146/53 | HR 86 | Temp 98.0°F | Resp 24 | Ht 64.0 in | Wt 255.0 lb

## 2021-04-03 DIAGNOSIS — D509 Iron deficiency anemia, unspecified: Secondary | ICD-10-CM

## 2021-04-03 DIAGNOSIS — D6489 Other specified anemias: Secondary | ICD-10-CM | POA: Diagnosis present

## 2021-04-03 DIAGNOSIS — Z23 Encounter for immunization: Secondary | ICD-10-CM | POA: Diagnosis not present

## 2021-04-03 MED ORDER — SODIUM CHLORIDE 0.9 % IV SOLN: Freq: Once | INTRAVENOUS | Status: AC

## 2021-04-03 MED ORDER — SODIUM CHLORIDE 0.9 % IV SOLN
300.0000 mg | Freq: Once | INTRAVENOUS | Status: AC
  Administered 2021-04-03: 300 mg via INTRAVENOUS
  Filled 2021-04-03: qty 15

## 2021-04-03 NOTE — Patient Instructions (Signed)
Ferumoxytol Injection What is this medication? FERUMOXYTOL (FER ue MOX i tol) treats low levels of iron in your body (iron deficiency anemia). Iron is a mineral that plays an important role in making red blood cells, which carry oxygen from your lungs to the rest of your body. This medicine may be used for other purposes; ask your health care provider or pharmacist if you have questions. COMMON BRAND NAME(S): Feraheme What should I tell my care team before I take this medication? They need to know if you have any of these conditions: Anemia not caused by low iron levels High levels of iron in the blood Magnetic resonance imaging (MRI) test scheduled An unusual or allergic reaction to iron, other medications, foods, dyes, or preservatives Pregnant or trying to get pregnant Breast-feeding How should I use this medication? This medication is for injection into a vein. It is given in a hospital or clinic setting. Talk to your care team the use of this medication in children. Special care may be needed. Overdosage: If you think you have taken too much of this medicine contact a poison control center or emergency room at once. NOTE: This medicine is only for you. Do not share this medicine with others. What if I miss a dose? It is important not to miss your dose. Call your care team if you are unable to keep an appointment. What may interact with this medication? Other iron products This list may not describe all possible interactions. Give your health care provider a list of all the medicines, herbs, non-prescription drugs, or dietary supplements you use. Also tell them if you smoke, drink alcohol, or use illegal drugs. Some items may interact with your medicine. What should I watch for while using this medication? Visit your care team regularly. Tell your care team if your symptoms do not start to get better or if they get worse. You may need blood work done while you are taking this  medication. You may need to follow a special diet. Talk to your care team. Foods that contain iron include: whole grains/cereals, dried fruits, beans, or peas, leafy green vegetables, and organ meats (liver, kidney). What side effects may I notice from receiving this medication? Side effects that you should report to your care team as soon as possible: Allergic reactions-skin rash, itching, hives, swelling of the face, lips, tongue, or throat Low blood pressure-dizziness, feeling faint or lightheaded, blurry vision Shortness of breath Side effects that usually do not require medical attention (report to your care team if they continue or are bothersome): Flushing Headache Joint pain Muscle pain Nausea Pain, redness, or irritation at injection site This list may not describe all possible side effects. Call your doctor for medical advice about side effects. You may report side effects to FDA at 1-800-FDA-1088. Where should I keep my medication? This medication is given in a hospital or clinic and will not be stored at home. NOTE: This sheet is a summary. It may not cover all possible information. If you have questions about this medicine, talk to your doctor, pharmacist, or health care provider.  2022 Elsevier/Gold Standard (2020-10-24 15:35:12)  

## 2021-04-03 NOTE — Progress Notes (Signed)
1250 Discharged home, stable- States understanding to call for any concerns

## 2021-04-10 ENCOUNTER — Other Ambulatory Visit: Payer: Self-pay

## 2021-04-10 ENCOUNTER — Inpatient Hospital Stay: Payer: Medicare HMO

## 2021-04-10 VITALS — BP 121/99 | HR 72 | Temp 98.1°F | Resp 20 | Ht 64.0 in | Wt 254.1 lb

## 2021-04-10 DIAGNOSIS — Z23 Encounter for immunization: Secondary | ICD-10-CM

## 2021-04-10 DIAGNOSIS — D509 Iron deficiency anemia, unspecified: Secondary | ICD-10-CM

## 2021-04-10 DIAGNOSIS — D6489 Other specified anemias: Secondary | ICD-10-CM | POA: Diagnosis not present

## 2021-04-10 MED ORDER — SODIUM CHLORIDE 0.9 % IV SOLN: Freq: Once | INTRAVENOUS | Status: AC

## 2021-04-10 MED ORDER — INFLUENZA VAC SPLIT QUAD 0.5 ML IM SUSY: 0.5000 mL | PREFILLED_SYRINGE | Freq: Once | INTRAMUSCULAR | Status: DC

## 2021-04-10 MED ORDER — SODIUM CHLORIDE 0.9 % IV SOLN
300.0000 mg | Freq: Once | INTRAVENOUS | Status: AC
  Administered 2021-04-10: 300 mg via INTRAVENOUS
  Filled 2021-04-10: qty 15

## 2021-04-10 NOTE — Progress Notes (Signed)
Discharged home, stable  

## 2021-04-10 NOTE — Patient Instructions (Signed)

## 2021-04-16 MED FILL — Iron Sucrose Inj 20 MG/ML (Fe Equiv): INTRAVENOUS | Qty: 15 | Status: AC

## 2021-04-17 ENCOUNTER — Inpatient Hospital Stay: Payer: Medicare HMO

## 2021-04-17 ENCOUNTER — Other Ambulatory Visit: Payer: Self-pay

## 2021-04-17 VITALS — BP 133/67 | HR 88 | Temp 97.9°F | Resp 20 | Ht 64.0 in | Wt 255.0 lb

## 2021-04-17 DIAGNOSIS — D6489 Other specified anemias: Secondary | ICD-10-CM | POA: Diagnosis not present

## 2021-04-17 DIAGNOSIS — Z23 Encounter for immunization: Secondary | ICD-10-CM

## 2021-04-17 DIAGNOSIS — D509 Iron deficiency anemia, unspecified: Secondary | ICD-10-CM

## 2021-04-17 MED ORDER — INFLUENZA VAC SPLIT QUAD 0.5 ML IM SUSY
0.5000 mL | PREFILLED_SYRINGE | Freq: Once | INTRAMUSCULAR | Status: AC
  Administered 2021-04-17: 0.5 mL via INTRAMUSCULAR
  Filled 2021-04-17: qty 0.5

## 2021-04-17 MED ORDER — SODIUM CHLORIDE 0.9 % IV SOLN
300.0000 mg | Freq: Once | INTRAVENOUS | Status: AC
  Administered 2021-04-17: 300 mg via INTRAVENOUS
  Filled 2021-04-17: qty 300

## 2021-04-17 MED ORDER — SODIUM CHLORIDE 0.9 % IV SOLN: Freq: Once | INTRAVENOUS | Status: AC

## 2021-04-17 NOTE — Patient Instructions (Signed)

## 2021-06-19 NOTE — Progress Notes (Signed)
[facility]  [address] [place],  [place]  [address] [phone]  Clinic Day:  06/26/2021  Referring physician: Gala Lewandowsky, MD  This document serves as a record of services personally performed by Marice Potter, MD. It was created on their behalf by Curry,Lauren E, a trained medical scribe. The creation of this record is based on the scribe's personal observations and the provider's statements to them.  HISTORY OF PRESENT ILLNESS:  The patient is a 65 y.o. female with iron deficiency anemia.  She comes in today to reassess her labs after recently receiving IV iron.  Of note, past labs have also shown a component of renal insufficiency likely factoring into her anemia as well.  Since her last visit, she has been doing fairly well.  She denies having increased fatigue or any overt forms of blood loss which concern her for worsening anemia.  PHYSICAL EXAM:  Blood pressure (!) 179/86, pulse 82, temperature 99 F (37.2 C), resp. rate 16, height 5\' 4"  (1.626 m), weight 258 lb 11.2 oz (117.3 kg), SpO2 93 %. Wt Readings from Last 3 Encounters:  06/26/21 258 lb 11.2 oz (117.3 kg)  04/17/21 255 lb (115.7 kg)  04/10/21 254 lb 1.6 oz (115.3 kg)   Body mass index is 44.41 kg/m. Performance status (ECOG): 1 - Symptomatic but completely ambulatory Physical Exam Constitutional:      Appearance: Normal appearance. She is not ill-appearing.  HENT:     Mouth/Throat:     Mouth: Mucous membranes are moist.     Pharynx: Oropharynx is clear. No oropharyngeal exudate or posterior oropharyngeal erythema.  Cardiovascular:     Rate and Rhythm: Normal rate and regular rhythm.     Heart sounds: No murmur heard.   No friction rub. No gallop.  Pulmonary:     Effort: Pulmonary effort is normal. No respiratory distress.     Breath sounds: Normal breath sounds. No wheezing, rhonchi or rales.  Abdominal:     General: Bowel sounds are normal. There is no distension.      Palpations: Abdomen is soft. There is no mass.     Tenderness: There is no abdominal tenderness.  Musculoskeletal:        General: No swelling.     Right lower leg: No edema.     Left lower leg: No edema.  Lymphadenopathy:     Cervical: No cervical adenopathy.     Upper Body:     Right upper body: No supraclavicular or axillary adenopathy.     Left upper body: No supraclavicular or axillary adenopathy.     Lower Body: No right inguinal adenopathy. No left inguinal adenopathy.  Skin:    General: Skin is warm.     Coloration: Skin is not jaundiced.     Findings: No lesion or rash.  Neurological:     General: No focal deficit present.     Mental Status: She is alert and oriented to person, place, and time. Mental status is at baseline.  Psychiatric:        Mood and Affect: Mood normal.        Behavior: Behavior normal.        Thought Content: Thought content normal.   LABS:   CBC Latest Ref Rng & Units 06/26/2021 03/09/2021  WBC - 9.7 8.4  Hemoglobin 12.0 - 16.0 10.8(A) 8.6(A)  Hematocrit 36 - 46 34(A) 28(A)  Platelets 150 - 399 253 376   CMP Latest Ref Rng &  Units 06/26/2021 03/09/2021 10/03/2020  Glucose 65 - 99 mg/dL - - 81  BUN 4 - 21 31(A) 29(A) 33(H)  Creatinine 0.5 - 1.1 1.3(A) 1.5(A) 1.36(H)  Sodium 137 - 147 142 141 141  Potassium 3.4 - 5.3 3.9 3.9 5.1  Chloride 99 - 108 105 104 99  CO2 13 - 22 29(A) 21 22  Calcium 8.7 - 10.7 9.4 9.2 10.5(H)  Alkaline Phos 25 - 125 98 80 -  AST 13 - 35 37(A) 45(A) -  ALT 7 - 35 40(A) 53(A) -    Latest Reference Range & Units 06/26/21 10:02  Iron 28 - 170 ug/dL 99  UIBC ug/dL 277  TIBC 250 - 450 ug/dL 376  Saturation Ratios 10.4 - 31.8 % 26  Ferritin 11 - 307 ng/mL 499 (H)  (H): Data is abnormally high   ASSESSMENT & PLAN:  A 65 y.o. female with iron deficiency anemia, as well as anemia secondary to chronic renal insufficiency.  I am pleased as her hemoglobin has improved fairly significantly since receiving her IV iron.  The  fact that he remains mildly anemic suggests her renal insufficiency is also playing a major role into her anemia.  However, as her hemoglobin is above 10 and her iron parameters are normal, she will be followed conservatively over these next few months.  I will see her back in 4 months for repeat clinical assessment.  The patient understands all the plans discussed today and is in agreement with them.  I, Rita Ohara, am acting as scribe for Marice Potter, MD    I have reviewed this report as typed by the medical scribe, and it is complete and accurate.  Julliana Whitmyer Macarthur Critchley, MD

## 2021-06-26 ENCOUNTER — Other Ambulatory Visit: Payer: Self-pay

## 2021-06-26 ENCOUNTER — Inpatient Hospital Stay: Payer: Medicare HMO | Admitting: Oncology

## 2021-06-26 ENCOUNTER — Other Ambulatory Visit: Payer: Self-pay | Admitting: Hematology and Oncology

## 2021-06-26 ENCOUNTER — Other Ambulatory Visit: Payer: Self-pay | Admitting: Oncology

## 2021-06-26 ENCOUNTER — Inpatient Hospital Stay: Payer: Medicare HMO | Attending: Oncology

## 2021-06-26 VITALS — BP 179/86 | HR 82 | Temp 99.0°F | Resp 16 | Ht 64.0 in | Wt 258.7 lb

## 2021-06-26 DIAGNOSIS — D631 Anemia in chronic kidney disease: Secondary | ICD-10-CM | POA: Insufficient documentation

## 2021-06-26 DIAGNOSIS — N189 Chronic kidney disease, unspecified: Secondary | ICD-10-CM | POA: Insufficient documentation

## 2021-06-26 DIAGNOSIS — D509 Iron deficiency anemia, unspecified: Secondary | ICD-10-CM

## 2021-06-26 LAB — COMPREHENSIVE METABOLIC PANEL
Albumin: 4.3 (ref 3.5–5.0)
Calcium: 9.4 (ref 8.7–10.7)

## 2021-06-26 LAB — BASIC METABOLIC PANEL
BUN: 31 — AB (ref 4–21)
CO2: 29 — AB (ref 13–22)
Chloride: 105 (ref 99–108)
Creatinine: 1.3 — AB (ref 0.5–1.1)
Glucose: 157
Potassium: 3.9 (ref 3.4–5.3)
Sodium: 142 (ref 137–147)

## 2021-06-26 LAB — IRON AND TIBC
Iron: 99 ug/dL (ref 28–170)
Saturation Ratios: 26 % (ref 10.4–31.8)
TIBC: 376 ug/dL (ref 250–450)
UIBC: 277 ug/dL

## 2021-06-26 LAB — FERRITIN: Ferritin: 499 ng/mL — ABNORMAL HIGH (ref 11–307)

## 2021-06-26 LAB — CBC AND DIFFERENTIAL
HCT: 34 — AB (ref 36–46)
Hemoglobin: 10.8 — AB (ref 12.0–16.0)
Neutrophils Absolute: 6.6
Platelets: 253 (ref 150–399)
WBC: 9.7

## 2021-06-26 LAB — HEPATIC FUNCTION PANEL
ALT: 40 — AB (ref 7–35)
AST: 37 — AB (ref 13–35)
Alkaline Phosphatase: 98 (ref 25–125)
Bilirubin, Total: 0.5

## 2021-06-26 LAB — CBC: RBC: 3.73 — AB (ref 3.87–5.11)

## 2021-06-29 LAB — SOLUBLE TRANSFERRIN RECEPTOR: Transferrin Receptor: 33.8 nmol/L — ABNORMAL HIGH (ref 12.2–27.3)

## 2021-07-14 DIAGNOSIS — R079 Chest pain, unspecified: Secondary | ICD-10-CM

## 2021-07-22 ENCOUNTER — Ambulatory Visit: Payer: Medicare HMO | Admitting: Cardiology

## 2021-07-22 ENCOUNTER — Other Ambulatory Visit: Payer: Self-pay

## 2021-07-22 ENCOUNTER — Encounter: Payer: Self-pay | Admitting: Cardiology

## 2021-07-22 VITALS — BP 138/84 | HR 78 | Ht 64.0 in | Wt 280.0 lb

## 2021-07-22 DIAGNOSIS — R0989 Other specified symptoms and signs involving the circulatory and respiratory systems: Secondary | ICD-10-CM | POA: Diagnosis not present

## 2021-07-22 DIAGNOSIS — M79604 Pain in right leg: Secondary | ICD-10-CM

## 2021-07-22 DIAGNOSIS — I739 Peripheral vascular disease, unspecified: Secondary | ICD-10-CM | POA: Diagnosis not present

## 2021-07-22 DIAGNOSIS — I10 Essential (primary) hypertension: Secondary | ICD-10-CM

## 2021-07-22 DIAGNOSIS — M79605 Pain in left leg: Secondary | ICD-10-CM

## 2021-07-22 NOTE — Progress Notes (Signed)
Cardiology Office Note:    Date:  07/22/2021   ID:  Stefanie Campbell, DOB 05-28-1956, MRN [id number]  PCP:  Gala Lewandowsky, MD  Cardiologist:  Berniece Salines, DO  Electrophysiologist:  None   Referring MD: Gala Lewandowsky, MD   Chief Complaint  Patient presents with   Follow-up   Edema   Shortness of Breath    History of Present Illness:    Stefanie Campbell is a 66 y.o. female with a hx of hypertension, hyperlipidemia, giant cell arthritis on prednisone, significant family history of coronary artery disease, obesity, sleep apnea on cpap, diagnosis of paroxysmal atrial fibrillation many years ago but the patient tells me that this was never confirmed.   I did see the patient February 26, 2020 at which time I added Zaroxolyn 5 mg for 7 days to her diuretic regimen.  She did take this medicine and she says she has some relief from this but thinks that the Lasix may not be completely working for her anymore.   I saw the patient March 14, 2020 at that time and stopped her Lasix and started the patient on Bumex 2 mg twice daily.     I saw the patient on April 04, 2020 at that time she appeared to be doing well from a cardiovascular standpoint.  She had had significant improvement with Bumex in terms of her leg edema.  I saw the patient on October 03, 2020 at that time she appeared to be doing well from a cardiovascular standpoint.  She had responded very well to her Bumex.  Since also the patient she has been hospitalized at [facility] for at least 3 times back in September she tells me she has significant chest pain and shortness of breath.  She ended up going to the emergency department.  She was diagnosed with bilateral pulmonary embolism and was started on Eliquis 5 mg twice daily.  After that she subsequently needed a follow-up visit in the ED and again was admitted to [facility].  During that time she had a nuclear stress test which showed no  evidence of ischemia or infarction.  However showed that she had a depressed ejection fraction at 40%.  She was asked to see cardiology about this.  She has not had any shortness of breath no chest pain.  She is actually doing well on a Bumex.  She has not had any significant leg edema.  Past Medical History:  Diagnosis Date   Aftercare following surgery 11/15/2017   Allergy    Anemia    Arthritis    Bilateral leg edema 11/15/2019   Blood transfusion without reported diagnosis    allergic and had to stop 15 minutes in    Chest pain 10/06/2019   Chronic pain of right ankle 07/12/2017   Chronic venous insufficiency 07/12/2017   Class 3 severe obesity in adult [facility]) 11/24/2018   Current use of steroid medication 01/26/2019   Essential hypertension 10/06/2019   GERD (gastroesophageal reflux disease)    Giant cell arteritis ([place]) 11/24/2018   Graves disease    Hyperlipidemia    Hypertension    Liver disease    Low back pain 03/12/2013   Low back strain 02/16/2016   Mixed hyperlipidemia 10/06/2019   Morbid obesity ([place]) 02/26/2020   Osteoarthritis of hip 06/05/2018   Other headache syndrome 01/26/2019   PAF (paroxysmal atrial fibrillation) ([place]) 04/13/2015   Formatting of this note might be different from the original. CHADS2 vasc  score= 2 Formatting of this note might be different from the original. Overview:  CHADS2 vasc score= 2   Pain in unspecified knee 03/12/2013   Palpitations 10/06/2019   Primary osteoarthritis of one hip, right 06/05/2018   Sleep apnea    wears cpap    Stroke [facility])    mini stroke per pt    Syncope 04/13/2015   Tendonitis of ankle, right 07/12/2017    Past Surgical History:  Procedure Laterality Date   ABDOMINAL HYSTERECTOMY     BREAST SURGERY     benign cyst   CARPAL TUNNEL RELEASE Right    CESAREAN SECTION     x2   COLONOSCOPY     FOOT SURGERY     on toe    KNEE ARTHROSCOPY Left    temperol biopsy     TOTAL HIP ARTHROPLASTY Right     Current  Medications: Current Meds  Medication Sig   albuterol (VENTOLIN HFA) 108 (90 Base) MCG/ACT inhaler albuterol sulfate HFA 90 mcg/actuation aerosol inhaler  TAKE 2 PUFFS BY MOUTH EVERY 4 TO 6 HOURS AS NEEDED   alendronate (FOSAMAX) 70 MG tablet Fosamax 70 mg tablet  Take 1 tablet every week by oral route before meals.   apixaban (ELIQUIS) 5 MG TABS tablet Take 5 mg by mouth 2 (two) times daily.   bumetanide (BUMEX) 2 MG tablet Take 0.5 tablets (1 mg total) by mouth daily.   Calcium Carbonate (CALCIUM 600 PO) Take 1 tablet by mouth daily.    cetirizine (ZYRTEC) 10 MG tablet Take 10 mg by mouth as needed.    Cholecalciferol (VITAMIN D) 50 MCG (2000 UT) CAPS Take 1 capsule by mouth daily.   famotidine (PEPCID) 20 MG tablet Take 20 mg by mouth daily.   gemfibrozil (LOPID) 600 MG tablet Take 300 mg by mouth in the morning and at bedtime.   insulin aspart (NOVOLOG FLEXPEN) 100 UNIT/ML FlexPen Novolog FlexPen U-100 Insulin aspart 100 unit/mL (3 mL) subcutaneous  PLEASE SEE ATTACHED FOR DETAILED DIRECTIONS   insulin glargine (LANTUS SOLOSTAR) 100 UNIT/ML Solostar Pen Inject 25 Units into the skin daily.   lisinopril (ZESTRIL) 40 MG tablet Take 40 mg by mouth daily.   lovastatin (MEVACOR) 20 MG tablet Take by mouth.   meloxicam (MOBIC) 15 MG tablet    metFORMIN (GLUCOPHAGE) 500 MG tablet Take 500 mg by mouth 2 (two) times daily with a meal.   methocarbamol (ROBAXIN) 750 MG tablet Take 750 mg by mouth daily as needed.    metoprolol succinate (TOPROL-XL) 50 MG 24 hr tablet Take 50 mg by mouth daily.   pantoprazole (PROTONIX) 20 MG tablet Take 20 mg by mouth daily.   potassium chloride SA (KLOR-CON M20) 20 MEQ tablet Take 1 tablet (20 mEq total) by mouth 2 (two) times daily.   pravastatin (PRAVACHOL) 80 MG tablet Take 80 mg by mouth daily.   predniSONE (DELTASONE) 5 MG tablet Take 5 mg by mouth daily.   SYMBICORT 160-4.5 MCG/ACT inhaler Inhale 2 puffs into the lungs 2 (two) times daily.    Tocilizumab  (ACTEMRA) 162 MG/0.9ML SOSY once a week.    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  Tobacco Use   Smoking status: Never   Smokeless tobacco: Never  Substance and Sexual Activity   Alcohol use: Not Currently   Drug use: Never   Sexual activity: Not Currently  Other Topics Concern   Not on file  Social History Narrative   Not on file   Social Determinants of Health   Financial Resource Strain: Not on file  Food Insecurity: Not on file  Transportation Needs: Not on file  Physical Activity: Not on file  Stress: Not on file  Social Connections: Not on file     Family History: The patient's family history includes Breast cancer in her sister; Colon cancer in her father; Colon polyps in her sister; Heart attack in her sister and sister. There is no history of Esophageal cancer, Rectal cancer, or Stomach cancer.  ROS:   Review of Systems  Constitution: Negative for decreased appetite, fever and weight gain.  HENT: Negative for congestion, ear discharge, hoarse voice and sore throat.   Eyes: Negative for discharge, redness, vision loss in right eye and visual halos.  Cardiovascular: Negative for chest pain, dyspnea on exertion, leg swelling, orthopnea and palpitations.  Respiratory: Negative for cough, hemoptysis, shortness of breath and snoring.   Endocrine: Negative for heat intolerance and polyphagia.  Hematologic/Lymphatic: Negative for bleeding problem. Does not bruise/bleed easily.  Skin: Negative for flushing, nail changes, rash and suspicious lesions.   Musculoskeletal: Negative for arthritis, joint pain, muscle cramps, myalgias, neck pain and stiffness.  Gastrointestinal: Negative for abdominal pain, bowel incontinence, diarrhea and excessive appetite.  Genitourinary: Negative for decreased libido, genital sores and incomplete emptying.  Neurological: Negative for brief paralysis, focal weakness, headaches and loss of balance.  Psychiatric/Behavioral: Negative for altered mental status, depression and suicidal ideas.  Allergic/Immunologic: Negative for HIV exposure and persistent infections.    EKGs/Labs/Other Studies Reviewed:    The following studies were reviewed today:   EKG: None today  Nuclear stress test in January 2023 at rate of hospital showed no infarction or ischemia.  EF 40%.  TTE Impression 10/25/2019  1. Left ventricular ejection fraction, by estimation, is 50 to 55%. The  left ventricle has low normal function on limited apical views only. Left ventricular endocardial border not optimally defined to evaluate regional wall motion. Left ventricular  diastolic parameters are consistent with Grade I diastolic dysfunction (impaired relaxation).   2. Right ventricular systolic function was not well visualized. The right ventricular size is not well visualized.   3. The mitral valve was not well visualized. No evidence of mitral valve regurgitation.   4. The aortic valve was not well visualized. Aortic valve regurgitation is not visualized. No aortic stenosis is present.   5. The inferior vena cava is normal in size with greater than 50% respiratory variability, suggesting right atrial pressure of 3 mmHg.    Pharmacologic stress test 10/25/2019 Nuclear stress EF: 57%. The left ventricular ejection fraction is normal (55-65%). There was no ST segment deviation noted during stress. Defect 1: There is a small defect of moderate severity present in the mid inferior, apical inferior and apex location. This is most c/w breast and  diaphragmatic attenuation. The apex and apical inferior walls contract normally . This is a low risk study. No evidence of ischemia or previous infarction The study is normal.    Recent Labs: 10/03/2020: Magnesium 2.0 06/26/2021: ALT 40;  BUN 31; Creatinine 1.3; Hemoglobin 10.8; Platelets 253; Potassium 3.9; Sodium 142  Recent Lipid Panel No results found for: CHOL, TRIG, HDL, CHOLHDL, VLDL, LDLCALC, LDLDIRECT  Physical Exam:    VS:  BP 138/84 (BP Location: Left Arm, Patient Position: Sitting, Cuff Size: Large)    Pulse 78    Ht 5\' 4"  (1.626 m)    Wt 280 lb (127 kg)    BMI 48.06 kg/m     Wt Readings from Last 3 Encounters:  07/22/21 280 lb (127 kg)  06/26/21 258 lb 11.2 oz (117.3 kg)  04/17/21 255 lb (115.7 kg)     GEN: Well nourished, well developed in no acute distress HEENT: Normal NECK: No JVD; No carotid bruits LYMPHATICS: No lymphadenopathy CARDIAC: S1S2 noted,RRR, no murmurs, rubs, gallops RESPIRATORY:  Clear to auscultation without rales, wheezing or rhonchi  ABDOMEN: Soft, non-tender, non-distended, +bowel sounds, no guarding. EXTREMITIES: No edema, No cyanosis, no clubbing MUSCULOSKELETAL:  No deformity  SKIN: Warm and dry NEUROLOGIC:  Alert and oriented x 3, non-focal PSYCHIATRIC:  Normal affect, good insight  ASSESSMENT:    1. Left leg claudication ([place])   2. Right leg claudication ([place])   3. Bilateral leg pain   4. Depressed left ventricular ejection fraction   5. Morbid obesity ([place])   6. Essential hypertension    PLAN:     The nuclear study done in January 2023 and ran of hospital showed depressed ejection fraction 40%.  This is a change from her baseline where her echocardiogram showed 50 to 55% EF.  With this new finding I will like to get an echocardiogram to reassess the patient's LV function for confirmation.  In the meantime we will hold off on any medication changes until we can confirm this.  She is complaining of some bilateral leg claudication  when she walks.  We are going to get bilateral arterial Doppler for this patient.  Medically she is euvolemic, continue her current Bumex dosing.  The patient understands the need to lose weight with diet and exercise. We have discussed specific strategies for this.  She is being treated with Eliquis 5 mg twice daily for her pulmonary embolism which is managed by her primary provider.  The patient is in agreement with the above plan. The patient left the office in stable condition.  The patient will follow up in 6 months unless if her EF is actually depressed.   Medication Adjustments/Labs and Tests Ordered: Current medicines are reviewed at length with the patient today.  Concerns regarding medicines are outlined above.  Orders Placed This Encounter  Procedures   ECHOCARDIOGRAM COMPLETE   VAS [place] LOWER EXTREMITY ARTERIAL DUPLEX   No orders of the defined types were placed in this encounter.   Patient Instructions  Medication Instructions:  Your physician recommends that you continue on your current medications as directed. Please refer to the Current Medication list given to you today.  *If you need a refill on your cardiac medications before your next appointment, please call your pharmacy*   Lab Work: None If you have labs (blood work) drawn today and your tests are completely normal, you will receive your results only by: [place] (if you have MyChart) OR A paper copy in the mail If you have any lab test that is abnormal or we need to change your treatment, we will call you to review the results.   Testing/Procedures: Your physician has requested that you have an echocardiogram. Echocardiography is a painless test that  uses sound waves to create images of your heart. It provides your doctor with information about the size and shape of your heart and how well your hearts chambers and valves are working. This procedure takes approximately one hour. There are no  restrictions for this procedure.  Your physician has requested that you have a lower extremity arterial duplex. This test is an ultrasound of the arteries in the legs.. It looks at arterial blood flow in the legs. Allow one hour for Lower  Arterial scans. There are no restrictions or special instructions   Follow-Up: At [facility], you and your health needs are our priority.  As part of our continuing mission to provide you with exceptional heart care, we have created designated Provider Care Teams.  These Care Teams include your primary Cardiologist (physician) and Advanced Practice Providers (APPs -  Physician Assistants and Nurse Practitioners) who all work together to provide you with the care you need, when you need it.  We recommend signing up for the patient portal called "MyChart".  Sign up information is provided on this After Visit Summary.  MyChart is used to connect with patients for Virtual Visits (Telemedicine).  Patients are able to view lab/test results, encounter notes, upcoming appointments, etc.  Non-urgent messages can be sent to your provider as well.   To learn more about what you can do with MyChart, go to [email or web address].    Your next appointment:   6 month(s)  The format for your next appointment:   In Person  Provider:   Berniece Salines, DO     Other Instructions     Adopting a Healthy Lifestyle.  Know what a healthy weight is for you (roughly BMI <25) and aim to maintain this   Aim for 7+ servings of fruits and vegetables daily   65-80+ fluid ounces of water or unsweet tea for healthy kidneys   Limit to max 1 drink of alcohol per day; avoid smoking/tobacco   Limit animal fats in diet for cholesterol and heart health - choose grass fed whenever available   Avoid highly processed foods, and foods high in saturated/trans fats   Aim for low stress - take time to unwind and care for your mental health   Aim for 150 min of moderate intensity  exercise weekly for heart health, and weights twice weekly for bone health   Aim for 7-9 hours of sleep daily   When it comes to diets, agreement about the perfect plan isnt easy to find, even among the experts. Experts at the [place] developed an idea known as the Healthy Eating Plate. Just imagine a plate divided into logical, healthy portions.   The emphasis is on diet quality:   Load up on vegetables and fruits - one-half of your plate: Aim for color and variety, and remember that potatoes dont count.   Go for whole grains - one-quarter of your plate: Whole wheat, barley, wheat berries, quinoa, oats, brown rice, and foods made with them. If you want pasta, go with whole wheat pasta.   Protein power - one-quarter of your plate: Fish, chicken, beans, and nuts are all healthy, versatile protein sources. Limit red meat.   The diet, however, does go beyond the plate, offering a few other suggestions.   Use healthy plant oils, such as olive, canola, soy, corn, sunflower and peanut. Check the labels, and avoid partially hydrogenated oil, which have unhealthy trans fats.   If youre thirsty, drink water. Coffee  and tea are good in moderation, but skip sugary drinks and limit milk and dairy products to one or two daily servings.   The type of carbohydrate in the diet is more important than the amount. Some sources of carbohydrates, such as vegetables, fruits, whole grains, and beans-are healthier than others.   Finally, stay active  Signed, Berniece Salines, DO  07/22/2021 9:09 PM    [facility]

## 2021-07-22 NOTE — Patient Instructions (Addendum)
Medication Instructions:  Your physician recommends that you continue on your current medications as directed. Please refer to the Current Medication list given to you today.  *If you need a refill on your cardiac medications before your next appointment, please call your pharmacy*   Lab Work: None If you have labs (blood work) drawn today and your tests are completely normal, you will receive your results only by: [place] (if you have MyChart) OR A paper copy in the mail If you have any lab test that is abnormal or we need to change your treatment, we will call you to review the results.   Testing/Procedures: Your physician has requested that you have an echocardiogram. Echocardiography is a painless test that uses sound waves to create images of your heart. It provides your doctor with information about the size and shape of your heart and how well your hearts chambers and valves are working. This procedure takes approximately one hour. There are no restrictions for this procedure.  Your physician has requested that you have a lower extremity arterial duplex. This test is an ultrasound of the arteries in the legs.. It looks at arterial blood flow in the legs. Allow one hour for Lower  Arterial scans. There are no restrictions or special instructions   Follow-Up: At [facility], you and your health needs are our priority.  As part of our continuing mission to provide you with exceptional heart care, we have created designated Provider Care Teams.  These Care Teams include your primary Cardiologist (physician) and Advanced Practice Providers (APPs -  Physician Assistants and Nurse Practitioners) who all work together to provide you with the care you need, when you need it.  We recommend signing up for the patient portal called "MyChart".  Sign up information is provided on this After Visit Summary.  MyChart is used to connect with patients for Virtual Visits (Telemedicine).   Patients are able to view lab/test results, encounter notes, upcoming appointments, etc.  Non-urgent messages can be sent to your provider as well.   To learn more about what you can do with MyChart, go to [email or web address].    Your next appointment:   6 month(s)  The format for your next appointment:   In Person  Provider:   Berniece Salines, DO     Other Instructions

## 2021-07-30 ENCOUNTER — Other Ambulatory Visit: Payer: Self-pay

## 2021-07-30 ENCOUNTER — Ambulatory Visit (INDEPENDENT_AMBULATORY_CARE_PROVIDER_SITE_OTHER): Payer: Medicare HMO

## 2021-07-30 DIAGNOSIS — M79605 Pain in left leg: Secondary | ICD-10-CM | POA: Diagnosis not present

## 2021-07-30 DIAGNOSIS — M79604 Pain in right leg: Secondary | ICD-10-CM

## 2021-07-30 DIAGNOSIS — I739 Peripheral vascular disease, unspecified: Secondary | ICD-10-CM | POA: Diagnosis not present

## 2021-07-31 ENCOUNTER — Telehealth: Payer: Self-pay | Admitting: Cardiology

## 2021-07-31 ENCOUNTER — Ambulatory Visit (INDEPENDENT_AMBULATORY_CARE_PROVIDER_SITE_OTHER): Payer: Medicare HMO

## 2021-07-31 DIAGNOSIS — R0989 Other specified symptoms and signs involving the circulatory and respiratory systems: Secondary | ICD-10-CM | POA: Diagnosis not present

## 2021-07-31 DIAGNOSIS — I10 Essential (primary) hypertension: Secondary | ICD-10-CM

## 2021-07-31 LAB — ECHOCARDIOGRAM COMPLETE
Area-P 1/2: 4.68 cm2
Calc EF: 47.9 %
S' Lateral: 3.5 cm
Single Plane A2C EF: 55.9 %
Single Plane A4C EF: 36 %

## 2021-07-31 NOTE — Telephone Encounter (Signed)
Follow Up:     Patient is returning Jasmine's call, concerning her test results.

## 2021-07-31 NOTE — Telephone Encounter (Signed)
Called patient. Patient made aware of her ultrasound results. Verbalized understanding. No questions or concerns expressed at this time.

## 2021-07-31 NOTE — Telephone Encounter (Signed)
Spoke to patient lower ext doppler results given.Advised echo done today results not available.I will sent message to Dr.Tobb's RN.

## 2021-10-23 ENCOUNTER — Telehealth: Payer: Self-pay

## 2021-10-23 ENCOUNTER — Inpatient Hospital Stay: Payer: Medicare HMO | Attending: Oncology | Admitting: Oncology

## 2021-10-23 ENCOUNTER — Inpatient Hospital Stay: Payer: Medicare HMO

## 2021-10-23 ENCOUNTER — Encounter (INDEPENDENT_AMBULATORY_CARE_PROVIDER_SITE_OTHER): Payer: Self-pay

## 2021-10-23 ENCOUNTER — Other Ambulatory Visit: Payer: Self-pay | Admitting: Hematology and Oncology

## 2021-10-23 VITALS — BP 159/76 | HR 85 | Temp 98.0°F | Resp 14 | Ht 64.0 in | Wt 261.0 lb

## 2021-10-23 DIAGNOSIS — D509 Iron deficiency anemia, unspecified: Secondary | ICD-10-CM

## 2021-10-23 LAB — HEPATIC FUNCTION PANEL
ALT: 37 U/L — AB (ref 7–35)
AST: 32 (ref 13–35)
Alkaline Phosphatase: 75 (ref 25–125)
Bilirubin, Total: 0.5

## 2021-10-23 LAB — CBC AND DIFFERENTIAL
HCT: 32 — AB (ref 36–46)
Hemoglobin: 9.7 — AB (ref 12.0–16.0)
Neutrophils Absolute: 3.11
Platelets: 259 10*3/uL (ref 150–400)
WBC: 6.1

## 2021-10-23 LAB — IRON AND TIBC
Iron: 89 ug/dL (ref 28–170)
Saturation Ratios: 26 % (ref 10.4–31.8)
TIBC: 347 ug/dL (ref 250–450)
UIBC: 258 ug/dL

## 2021-10-23 LAB — BASIC METABOLIC PANEL
BUN: 31 — AB (ref 4–21)
CO2: 26 — AB (ref 13–22)
Chloride: 102 (ref 99–108)
Creatinine: 1.3 — AB (ref 0.5–1.1)
Glucose: 154
Potassium: 3.9 mEq/L (ref 3.5–5.1)
Sodium: 141 (ref 137–147)

## 2021-10-23 LAB — COMPREHENSIVE METABOLIC PANEL
Albumin: 4.2 (ref 3.5–5.0)
Calcium: 9.7 (ref 8.7–10.7)

## 2021-10-23 LAB — CBC
MCV: 92 (ref 81–99)
RBC: 3.45 — AB (ref 3.87–5.11)

## 2021-10-23 LAB — FERRITIN: Ferritin: 332 ng/mL — ABNORMAL HIGH (ref 11–307)

## 2021-10-23 NOTE — Telephone Encounter (Signed)
Dr Bobby Rumpf reviewed pt's iron labs. He states, "these numbers are not consistent with iron deficiency anemia. I cant give her iron. She can keep f/u appt for 4 months or she can come back in 2 months if she wants". ? ? ? ?I notified pt of above. She verbalized understanding. She will keep the 4 month appt. If she starts feeling bad before then , she will call us back. ?

## 2021-10-23 NOTE — Progress Notes (Signed)
?[facility]  ?[address] ?[place],  [place]  [address] ?[phone] ? ?Clinic Day:  10/23/2021 ? ?Referring physician: Gala Lewandowsky, MD ? ?HISTORY OF PRESENT ILLNESS:  ?The patient is a 66 y.o. Campbell with iron deficiency anemia.  She comes in today to reassess her labs after recently receiving IV iron.  Of note, past labs have also shown a component of renal insufficiency likely factoring into her anemia as well.  Since her last visit, she has been doing fairly well.  However, she claims to be somewhat weaker.  She has also occasionally noticed mild bloody discharge in her underwear, which she believes is coming from her rectum.  Of note, she is scheduled to see her GI doctor next week as it pertains to her fatty liver disease. ? ?PHYSICAL EXAM:  ?Blood pressure (!) 159/76, pulse 85, temperature 98 ?F (36.7 ?C), resp. rate 14, height '5\' 4"'$  (1.626 m), weight 261 lb (118.4 kg), SpO2 94 %. ?Wt Readings from Last 3 Encounters:  ?10/23/21 261 lb (118.4 kg)  ?07/22/21 280 lb (127 kg)  ?06/26/21 258 lb 11.2 oz (117.3 kg)  ? ?Body mass index is 44.8 kg/m?Marland Kitchen ?Performance status (ECOG): 1 - Symptomatic but completely ambulatory ?Physical Exam ?Constitutional:   ?   Appearance: Normal appearance. She is not ill-appearing.  ?HENT:  ?   Mouth/Throat:  ?   Mouth: Mucous membranes are moist.  ?   Pharynx: Oropharynx is clear. No oropharyngeal exudate or posterior oropharyngeal erythema.  ?Cardiovascular:  ?   Rate and Rhythm: Normal rate and regular rhythm.  ?   Heart sounds: No murmur heard. ?  No friction rub. No gallop.  ?Pulmonary:  ?   Effort: Pulmonary effort is normal. No respiratory distress.  ?   Breath sounds: Normal breath sounds. No wheezing, rhonchi or rales.  ?Abdominal:  ?   General: Bowel sounds are normal. There is no distension.  ?   Palpations: Abdomen is soft. There is no mass.  ?   Tenderness: There is no abdominal tenderness.  ?Musculoskeletal:     ?   General: No  swelling.  ?   Right lower leg: No edema.  ?   Left lower leg: No edema.  ?Lymphadenopathy:  ?   Cervical: No cervical adenopathy.  ?   Upper Body:  ?   Right upper body: No supraclavicular or axillary adenopathy.  ?   Left upper body: No supraclavicular or axillary adenopathy.  ?   Lower Body: No right inguinal adenopathy. No left inguinal adenopathy.  ?Skin: ?   General: Skin is warm.  ?   Coloration: Skin is not jaundiced.  ?   Findings: No lesion or rash.  ?Neurological:  ?   General: No focal deficit present.  ?   Mental Status: She is alert and oriented to person, place, and time. Mental status is at baseline.  ?Psychiatric:     ?   Mood and Affect: Mood normal.     ?   Behavior: Behavior normal.     ?   Thought Content: Thought content normal.  ? ?LABS:  ? ? ?  Latest Ref Rng & Units 10/23/2021  ? 12:00 AM 06/26/2021  ? 12:00 AM 03/09/2021  ? 12:00 AM  ?CBC  ?WBC  6.1      9.7      8.4       ?Hemoglobin 12.0 - 16.0 9.7      10.8  8.6       ?Hematocrit 36 - 46 32      34      28       ?Platelets 150 - 400 K/uL 259      253      376       ?  ? This result is from an external source.  ? ? ?  Latest Ref Rng & Units 10/23/2021  ? 12:00 AM 06/26/2021  ? 12:00 AM 03/09/2021  ? 12:00 AM  ?CMP  ?BUN 4 - '21 31      31      29       '$ ?Creatinine 0.5 - 1.1 1.3      1.3      1.5       ?Sodium 137 - 147 141      142      141       ?Potassium 3.5 - 5.1 mEq/L 3.9      3.9      3.9       ?Chloride 99 - 108 102      105      104       ?CO2 13 - '22 26      29      21       '$ ?Calcium 8.7 - 10.7 9.7      9.4      9.2       ?Alkaline Phos 25 - 125 75      98      80       ?AST 13 - 35 32      37      45       ?ALT 7 - 35 U/L 37      40      53       ?  ? This result is from an external source.  ? ? Latest Reference Range & Units 10/23/21 09:05  ?Iron 28 - 170 ug/dL 89  ?UIBC ug/dL 258  ?TIBC 250 - 450 ug/dL 347  ?Saturation Ratios 10.4 - 31.8 % 26  ?Ferritin 11 - 307 ng/mL 332 (H)  ?(H): Data is abnormally high ? ?ASSESSMENT & PLAN:  ?A  66 y.o. Campbell with iron deficiency anemia, as well as anemia secondary to chronic renal insufficiency.  Her hemoglobin has fallen since her last visit.  When evaluating her labs today, her hemoglobin has fallen below 10.  Despite noticing from blood loss recently, her iron parameters today are actually normal.  This suggests her renal insufficiency may be playing a greater role in her anemia than previously anticipated.  I will speak with her about considering red cell shot therapy with the goal being to get her hemoglobin back above 10.  If she considers this, Retacrit shots will be given on a monthly basis.  I will see her back in 3 months for repeat clinical assessment.  The patient understands all the plans discussed today and is in agreement with them. ? ?Enio Hornback Macarthur Critchley, MD ? ? ? ?  ? ?

## 2021-10-28 ENCOUNTER — Ambulatory Visit (INDEPENDENT_AMBULATORY_CARE_PROVIDER_SITE_OTHER): Payer: Medicare HMO | Admitting: Gastroenterology

## 2021-10-28 ENCOUNTER — Encounter: Payer: Self-pay | Admitting: Gastroenterology

## 2021-10-28 VITALS — BP 146/86 | HR 85 | Ht 64.0 in | Wt 255.1 lb

## 2021-10-28 DIAGNOSIS — K649 Unspecified hemorrhoids: Secondary | ICD-10-CM

## 2021-10-28 DIAGNOSIS — K760 Fatty (change of) liver, not elsewhere classified: Secondary | ICD-10-CM | POA: Diagnosis not present

## 2021-10-28 DIAGNOSIS — R7989 Other specified abnormal findings of blood chemistry: Secondary | ICD-10-CM

## 2021-10-28 MED ORDER — HYDROCORTISONE (PERIANAL) 2.5 % EX CREA: 1.0000 "application " | TOPICAL_CREAM | Freq: Two times a day (BID) | CUTANEOUS | 2 refills | Status: DC

## 2021-10-28 NOTE — Patient Instructions (Addendum)
If you are age 66 or older, your body mass index should be between 23-30. Your Body mass index is 43.79 kg/m?Marland Kitchen If this is out of the aforementioned range listed, please consider follow up with your Primary Care Provider. ? ?If you are age 62 or younger, your body mass index should be between 19-25. Your Body mass index is 43.79 kg/m?Marland Kitchen If this is out of the aformentioned range listed, please consider follow up with your Primary Care Provider.  ? ?________________________________________________________ ? ?The [place] [facility] providers would like to encourage you to use [facility] to communicate with providers for non-urgent requests or questions.  Due to long hold times on the telephone, sending your provider a message by [facility] may be a faster and more efficient way to get a response.  Please allow 48 business hours for a response.  Please remember that this is for non-urgent requests.  ?_______________________________________________________ ? ?We have sent the following medications to your pharmacy for you to pick up at your convenience: ?Anusol Cream apply 2 times a day for 7 days ? ?No fatty foods or no sweet teas and try to start working like 30 minutes a day. Try to lose 10 pounds in the next 3 months ? ?Please call in 12 weeks to schedule an office visit. ? ?You have been scheduled for an abdominal ultrasound with elastography at [facility] (1st floor). Your appointment is scheduled for May 26th at [place]. Please arrive 30 minutes prior to your scheduled appointment for registration purposes. Make certain not to have anything to eat or drink after midnight prior to your procedure. Should you need to reschedule your appointment, you may contact radiology at [phone]. ? ?Liver Elastography ?Various chronic liver diseases such as hepatitis B, C, and fatty liver disease can lead to tissue damage and subsequent scar tissue formation. As the scar tissue accumulates, the liver loses some of its elasticity and  becomes stiffer. ?Liver elastography involves the use of a surface ultrasound probe that delivers a low frequency pulse or shear wave to a small volume of liver tissue under the rib cage. The transmission of the sound wave is completely painless. ?How Is a Liver Elastography Performed? ?The liver is located in the right upper abdomen under the rib cage. Patients are asked to lie flat on an examination table. A technician places the FibroScan probe between the ribs on the right side of the lower chest wall. A series of 10 painless pulses are then applied to the liver. The results are recorded on the equipment and an overall liver stiffness score is generated. This score is then interpreted by a qualified physician to predict the likelihood of advanced fibrosis or cirrhosis.  ?Patients are asked to wear loose clothing and should not consume any liquids or solids for a minimum of 4 hours before the test to increase the likelihood of obtaining reliable test results. The scan will take 10 to 15 minutes to complete, but patients should plan on being available for 30 minutes to allow time for preparation  ? ? ? ?Thank you, ? ?Dr. Jackquline Denmark ? ? ? ? ? ? ?We want to thank you for trusting [facility] with your care. All of our staff and providers value the relationships we have built with our patients, and it is an honor to care for you.  ? ?We are writing to let you know that [facility] will close on Nov 02, 2021, and we invite you to continue  to see Dr. Carmell Austria and Gerrit Heck at the [facility] [place] office location. We are consolidating our serices at these [facility] practices to better provide care. Our office staff will work with you to ensure a seamless transition.  ? ?Gerrit Heck, DO -Dr. Bryan Lemma will be movig to [facility] at [address] [place], [place] [address], effective Nov 02, 2021.  Contact [phone] to schedule  an appointment with him.  ? ?Carmell Austria, MD- Dr. Lyndel Safe will be movig to [facility] at [address] [place], [place] [address], effective Nov 02, 2021.  Contact [phone] to schedule an appointment with him.  ? ?Requesting Medical Records ?If you need to request your medical records, please follow the instructions below. Your medical records are confidential, and a copy can be transferred to another provider or released to you or another person you designate only with your permission. ? ?There are several ways to request your medical records: ?Requests for medical records can be submitted through our practice.   ?You can also request your records electronically, in your MyChart account by selecting the ?Request Health Records? tab.  ?If you need additional information on how to request records, please go to [email or web address] choose Patient Information, then select Request Medical Records. ?To make an appointment or if you have any questions about your health care needs, please contact our office at [phone] and one of our staff members will be glad to assist you. ?[place] is committed to providing exceptional care for you and our community. Thank you for allowing [place] to serve your health care needs. ?Sincerely, ? ?Windy Canny, Director [facility] ? also offers convenient virtual care options. Sore throat? Sinus problems? Cold or flu symptoms? Get care from the comfort of home with [facility] Video Visits and e-Visits. Learn more about the non-emergency conditions treated and start your virtual visit at [email or web address] ? ?

## 2021-10-28 NOTE — Progress Notes (Signed)
? ? ?Chief Complaint: Abnormal LFTs ? ?Referring Provider:  Gala Lewandowsky, MD    ? ? ?ASSESSMENT AND PLAN;  ? ?#1. Abn LFTs d/t fatty liver.  ? ?#2. GERD ? ?#3. Occ rectal bleeding d/t hoids. Neg colon 01/2019.  Next due 01/2024 d/t FH CRC (dad <60) ? ?Plan: ?-USE ?-Wt loss/wt loss/weight loss ?(Aim is to reduce 10lb over 3 months). No fried foods, No sweet tea except made at home, start walking 30 min/day. ?-HC 2.5% cream BID x 7 days prn. 2RF ?-FU in 12 weeks. ? ? ?HPI:   ? ?Stefanie Campbell is a 66 y.o. female  ?With H/O GCS on pred, OSA, HTN, morbid obesity, anemia of chronic disease (Hb 9.25 Jun 2021)d/t CKD3, DM2,  ? ?Adm to [facility] for atypical chest pains 06/2021- neg stress test with EF 40%, neg CTA ?Found to have Abn LFTs with AST/ALT 42/42 and lipase of 503 ?Underwent CT Abdo/pelvis with contrast showing hepatomegaly 18 cm with focal steatosis, no cirrhosis.  Normal pancreas and spleen. ? ?Sent to GI clinic for further eval. ? ?She has longstanding H/O fatty liver dating back to ultrasound 12/2008. ? ?Admits that she has gained weight on prednisone.  ? ?No H/O itching, skin lesions, easy bruisability, intake of OTC meds including diet pills, herbal medications, anabolic steroids or Tylenol. There is no H/O blood transfusions, IVDA or FH of liver disease. No jaundice, dark urine or pale stools. No alcohol abuse. ? ?Occ blood outside of stool with negative recent colonoscopy, attributed to hemorrhoids.  No constipation or diarrhea. ? ?No nausea, vomiting, heartburn, regurgitation, odynophagia or dysphagia. No melena. No unintentional weight loss. No abdominal pain. ? ? ? ? ? ? ?Previous GI work-up: ? ?Colonoscopy (FH colon ca dad < 43) 01/2019 ?-Diminutive colonic polyp S/P polypectomy. Bx- neg ?-Mild colonic diverticulosis. ?-Otherwise normal colonoscopy. ?-Rpt 5 yrs ? ?CT Abdo/pelvis with contrast 07/13/2021 [facility] ?-No acute abnormalities ?-Mild hepatomegaly.  Liver 18 cm.  Focal fatty changes.  No  cirrhosis ?-Normal pancreas/spleen. ?-Possible constipation ? ?Right upper quadrant ultrasound 01/07/2009 ?-Fatty liver ?-Focal sparing ? ?Past Medical History:  ?Diagnosis Date  ? Aftercare following surgery 11/15/2017  ? Allergy   ? Anemia   ? Arthritis   ? Bilateral leg edema 11/15/2019  ? Blood transfusion without reported diagnosis   ? allergic and had to stop 15 minutes in   ? Chest pain 10/06/2019  ? Chronic pain of right ankle 07/12/2017  ? Chronic venous insufficiency 07/12/2017  ? Class 3 severe obesity in adult [facility]) 11/24/2018  ? Current use of steroid medication 01/26/2019  ? Depression   ? Essential hypertension 10/06/2019  ? Family history of colon cancer   ? GERD (gastroesophageal reflux disease)   ? Giant cell arteritis ([place]) 11/24/2018  ? Graves disease   ? History of deep venous thrombosis   ? History of degenerative joint disease   ? Hyperlipidemia   ? Hypertension   ? Liver disease   ? Low back pain 03/12/2013  ? Low back strain 02/16/2016  ? Mixed hyperlipidemia 10/06/2019  ? Morbid obesity ([place]) 02/26/2020  ? Osteoarthritis of hip 06/05/2018  ? Other headache syndrome 01/26/2019  ? PAF (paroxysmal atrial fibrillation) ([place]) 04/13/2015  ? Formatting of this note might be different from the original. CHADS2 vasc score= 2 Formatting of this note might be different from the original. Overview:  CHADS2 vasc score= 2  ? Pain in unspecified knee 03/12/2013  ? Palpitations 10/06/2019  ? Primary  osteoarthritis of one hip, right 06/05/2018  ? Pulmonary embolism ([place])   ? Sleep apnea   ? wears cpap   ? Stroke [facility])   ? mini stroke per pt   ? Syncope 04/13/2015  ? Tendonitis of ankle, right 07/12/2017  ? ? ?Past Surgical History:  ?Procedure Laterality Date  ? ABDOMINAL HYSTERECTOMY    ? BSO  ? BREAST SURGERY    ? benign cyst  ? CARPAL TUNNEL RELEASE Right   ? CESAREAN SECTION    ? x2  ? COLONOSCOPY  08/09/2014  ? Mild pan colonic diverticulosis. Small internal hemorrhoids. Otherwise grossly normal  colonoscopy to the terninal ileum. The colon is highly redundant  ? FOOT SURGERY    ? on toe   ? KNEE ARTHROSCOPY Left   ? temperol biopsy    ? TOTAL HIP ARTHROPLASTY Right   ? ? ?Family History  ?Problem Relation Age of Onset  ? Colon cancer Father   ?     in his late 89's  ? Colon polyps Sister   ? Heart attack Sister   ? Breast cancer Sister   ? Heart attack Sister   ? Esophageal cancer Neg Hx   ? Rectal cancer Neg Hx   ? Stomach cancer Neg Hx   ? ? ?Social History  ? ?Tobacco Use  ? Smoking status: Never  ? Smokeless tobacco: Never  ?Substance Use Topics  ? Alcohol use: Not Currently  ? Drug use: Never  ? ? ?Current Outpatient Medications  ?Medication Sig Dispense Refill  ? albuterol (VENTOLIN HFA) 108 (90 Base) MCG/ACT inhaler albuterol sulfate HFA 90 mcg/actuation aerosol inhaler ? TAKE 2 PUFFS BY MOUTH EVERY 4 TO 6 HOURS AS NEEDED    ? alendronate (FOSAMAX) 70 MG tablet Fosamax 70 mg tablet ? Take 1 tablet every week by oral route before meals.    ? bumetanide (BUMEX) 2 MG tablet Take 0.5 tablets (1 mg total) by mouth daily. 90 tablet 3  ? Calcium Carbonate (CALCIUM 600 PO) Take 1 tablet by mouth daily.     ? cetirizine (ZYRTEC) 10 MG tablet Take 10 mg by mouth as needed.     ? Cholecalciferol (VITAMIN D) 50 MCG (2000 UT) CAPS Take 1 capsule by mouth daily.    ? famotidine (PEPCID) 20 MG tablet Take 20 mg by mouth daily.    ? gemfibrozil (LOPID) 600 MG tablet Take 300 mg by mouth in the morning and at bedtime.    ? insulin aspart (NOVOLOG FLEXPEN) 100 UNIT/ML FlexPen Novolog FlexPen U-100 Insulin aspart 100 unit/mL (3 mL) subcutaneous ? PLEASE SEE ATTACHED FOR DETAILED DIRECTIONS    ? insulin glargine (LANTUS SOLOSTAR) 100 UNIT/ML Solostar Pen Inject 25 Units into the skin daily.    ? meloxicam (MOBIC) 15 MG tablet 15 mg as needed.    ? metFORMIN (GLUCOPHAGE) 500 MG tablet Take 500 mg by mouth 2 (two) times daily with a meal.    ? methocarbamol (ROBAXIN) 750 MG tablet Take 750 mg by mouth daily as needed.      ? metoprolol succinate (TOPROL-XL) 50 MG 24 hr tablet Take 50 mg by mouth daily.    ? pantoprazole (PROTONIX) 20 MG tablet Take 20 mg by mouth daily.    ? potassium chloride SA (KLOR-CON M20) 20 MEQ tablet Take 1 tablet (20 mEq total) by mouth 2 (two) times daily. 180 tablet 3  ? pravastatin (PRAVACHOL) 80 MG tablet Take 80 mg by mouth daily.    ?  predniSONE (DELTASONE) 5 MG tablet Take 5 mg by mouth daily.    ? SYMBICORT 160-4.5 MCG/ACT inhaler Inhale 2 puffs into the lungs 2 (two) times daily.     ? Tocilizumab (ACTEMRA) 162 MG/0.9ML SOSY once a week.     ? ?Current Facility-Administered Medications  ?Medication Dose Route Frequency Provider Last Rate Last Admin  ? 0.9 %  sodium chloride infusion  500 mL Intravenous Once Jackquline Denmark, MD      ? ? ?Allergies  ?Allergen Reactions  ? Hydrocodone Shortness Of Breath  ?  Pt states she has taken percocet with no issues and believes she has taken dilaudid but is not positive ?  ? Magnesium-Containing Compounds Anaphylaxis  ?  Has been in hospital with last 2 colon preps   ? ? ?Review of Systems:  ?Constitutional: Denies fever, chills, diaphoresis, appetite change and has fatigue.  ?HEENT: Denies photophobia, eye pain, redness, hearing loss, ear pain, congestion, sore throat, rhinorrhea, sneezing, mouth sores, neck pain, neck stiffness and tinnitus.   ?Respiratory: Denies SOB, DOE, cough, chest tightness,  and wheezing.   ?Cardiovascular: Denies chest pain, palpitations and leg swelling.  ?Genitourinary: Denies dysuria, urgency, frequency, hematuria, flank pain and difficulty urinating.  ?Musculoskeletal: Has myalgias, back pain, joint swelling, arthralgias and gait problem.  ?Skin: No rash.  ?Neurological: Denies dizziness, seizures, syncope, weakness, light-headedness, numbness and headaches.  ?Hematological: Denies adenopathy. Easy bruising, personal or family bleeding history  ?Psychiatric/Behavioral: Has anxiety or depression ? ?  ? ?Physical Exam:   ? ?BP (!)  146/86   Pulse 85   Ht '5\' 4"'$  (1.626 m)   Wt 255 lb 2 oz (115.7 kg)   SpO2 94%   BMI 43.79 kg/m?  ?Wt Readings from Last 3 Encounters:  ?10/28/21 255 lb 2 oz (115.7 kg)  ?10/23/21 261 lb (118.4 kg)  ?07/22/21 280 lb

## 2021-11-13 ENCOUNTER — Ambulatory Visit (HOSPITAL_COMMUNITY)
Admission: RE | Admit: 2021-11-13 | Discharge: 2021-11-13 | Disposition: A | Discharge status: Home / Self Care | Payer: Medicare HMO | Source: Ambulatory Visit | Attending: Gastroenterology | Admitting: Gastroenterology

## 2021-11-13 DIAGNOSIS — R7989 Other specified abnormal findings of blood chemistry: Secondary | ICD-10-CM | POA: Diagnosis present

## 2021-11-13 DIAGNOSIS — K760 Fatty (change of) liver, not elsewhere classified: Secondary | ICD-10-CM | POA: Diagnosis present

## 2022-01-07 ENCOUNTER — Ambulatory Visit: Payer: Medicare HMO | Admitting: Gastroenterology

## 2022-02-25 ENCOUNTER — Other Ambulatory Visit: Payer: Self-pay | Admitting: Oncology

## 2022-02-25 DIAGNOSIS — D649 Anemia, unspecified: Secondary | ICD-10-CM

## 2022-02-25 NOTE — Progress Notes (Incomplete)
[facility]  [address] [place],  [place]  [address] [phone]  Clinic Day:  02/25/2022  Referring physician: No ref. provider found  HISTORY OF PRESENT ILLNESS:  The patient is a 66 y.o. female with iron deficiency anemia.  She comes in today to reassess her labs after recently receiving IV iron.  Of note, past labs have also shown a component of renal insufficiency likely factoring into her anemia as well.  Since her last visit, she has been doing fairly well.  However, she claims to be somewhat weaker.  She has also occasionally noticed mild bloody discharge in her underwear, which she believes is coming from her rectum.  Of note, she is scheduled to see her GI doctor next week as it pertains to her fatty liver disease.  PHYSICAL EXAM:  There were no vitals taken for this visit. Wt Readings from Last 3 Encounters:  10/28/21 255 lb 2 oz (115.7 kg)  10/23/21 261 lb (118.4 kg)  07/22/21 280 lb (127 kg)   There is no height or weight on file to calculate BMI. Performance status (ECOG): 1 - Symptomatic but completely ambulatory Physical Exam Constitutional:      Appearance: Normal appearance. She is not ill-appearing.  HENT:     Mouth/Throat:     Mouth: Mucous membranes are moist.     Pharynx: Oropharynx is clear. No oropharyngeal exudate or posterior oropharyngeal erythema.  Cardiovascular:     Rate and Rhythm: Normal rate and regular rhythm.     Heart sounds: No murmur heard.    No friction rub. No gallop.  Pulmonary:     Effort: Pulmonary effort is normal. No respiratory distress.     Breath sounds: Normal breath sounds. No wheezing, rhonchi or rales.  Abdominal:     General: Bowel sounds are normal. There is no distension.     Palpations: Abdomen is soft. There is no mass.     Tenderness: There is no abdominal tenderness.  Musculoskeletal:        General: No swelling.     Right lower leg: No edema.     Left lower leg: No edema.   Lymphadenopathy:     Cervical: No cervical adenopathy.     Upper Body:     Right upper body: No supraclavicular or axillary adenopathy.     Left upper body: No supraclavicular or axillary adenopathy.     Lower Body: No right inguinal adenopathy. No left inguinal adenopathy.  Skin:    General: Skin is warm.     Coloration: Skin is not jaundiced.     Findings: No lesion or rash.  Neurological:     General: No focal deficit present.     Mental Status: She is alert and oriented to person, place, and time. Mental status is at baseline.  Psychiatric:        Mood and Affect: Mood normal.        Behavior: Behavior normal.        Thought Content: Thought content normal.   LABS:      Latest Ref Rng & Units 10/23/2021   12:00 AM 06/26/2021   12:00 AM 03/09/2021   12:00 AM  CBC  WBC  6.1     9.7     8.4      Hemoglobin 12.0 - 16.0 9.7     10.8     8.6      Hematocrit 36 - 46 32  34     28      Platelets 150 - 400 K/uL 259     253     376         This result is from an external source.       Latest Ref Rng & Units 10/23/2021   12:00 AM 06/26/2021   12:00 AM 03/09/2021   12:00 AM  CMP  BUN 4 - '21 31     31     29      '$ Creatinine 0.5 - 1.1 1.3     1.3     1.5      Sodium 137 - 147 141     142     141      Potassium 3.5 - 5.1 mEq/L 3.9     3.9     3.9      Chloride 99 - 108 102     105     104      CO2 13 - '22 26     29     21      '$ Calcium 8.7 - 10.7 9.7     9.4     9.2      Alkaline Phos 25 - 125 75     98     80      AST 13 - 35 32     37     45      ALT 7 - 35 U/L 37     40     53         This result is from an external source.     Latest Reference Range & Units 10/23/21 09:05  Iron 28 - 170 ug/dL 89  UIBC ug/dL 258  TIBC 250 - 450 ug/dL 347  Saturation Ratios 10.4 - 31.8 % 26  Ferritin 11 - 307 ng/mL 332 (H)  (H): Data is abnormally high  ASSESSMENT & PLAN:  A 66 y.o. female with iron deficiency anemia, as well as anemia secondary to chronic renal insufficiency.  Her  hemoglobin has fallen since her last visit.  When evaluating her labs today, her hemoglobin has fallen below 10.  Despite noticing from blood loss recently, her iron parameters today are actually normal.  This suggests her renal insufficiency may be playing a greater role in her anemia than previously anticipated.  I will speak with her about considering red cell shot therapy with the goal being to get her hemoglobin back above 10.  If she considers this, Retacrit shots will be given on a monthly basis.  I will see her back in 3 months for repeat clinical assessment.  The patient understands all the plans discussed today and is in agreement with them.  Taquisha Phung Macarthur Critchley, MD

## 2022-02-25 NOTE — Progress Notes (Signed)
[facility]  [address] [place],  [place]  [address] [phone]  Clinic Day:  02/26/2022  Referring physician: Gala Lewandowsky, MD  HISTORY OF PRESENT ILLNESS:  The patient is a 66 y.o. female with anemia secondary to previous iron deficiency, as well as chronic renal insufficiency.  She comes in today to reassess her anemia.  Since her last visit, the patient has been doing fine.  She denies having increased fatigue or any overt forms of blood loss which concern her for progressive anemia.    PHYSICAL EXAM:  Blood pressure (!) 174/78, pulse 95, temperature 97.9 F (36.6 C), resp. rate 16, height '5\' 4"'$  (1.626 m), weight 246 lb 4.8 oz (111.7 kg), SpO2 98 %. Wt Readings from Last 3 Encounters:  02/26/22 246 lb 4.8 oz (111.7 kg)  10/28/21 255 lb 2 oz (115.7 kg)  10/23/21 261 lb (118.4 kg)   Body mass index is 42.28 kg/m. Performance status (ECOG): 1 - Symptomatic but completely ambulatory Physical Exam Constitutional:      Appearance: Normal appearance. She is not ill-appearing.  HENT:     Mouth/Throat:     Mouth: Mucous membranes are moist.     Pharynx: Oropharynx is clear. No oropharyngeal exudate or posterior oropharyngeal erythema.  Cardiovascular:     Rate and Rhythm: Normal rate and regular rhythm.     Heart sounds: No murmur heard.    No friction rub. No gallop.  Pulmonary:     Effort: Pulmonary effort is normal. No respiratory distress.     Breath sounds: Normal breath sounds. No wheezing, rhonchi or rales.  Abdominal:     General: Bowel sounds are normal. There is no distension.     Palpations: Abdomen is soft. There is no mass.     Tenderness: There is no abdominal tenderness.  Musculoskeletal:        General: No swelling.     Right lower leg: No edema.     Left lower leg: No edema.  Lymphadenopathy:     Cervical: No cervical adenopathy.     Upper Body:     Right upper body: No supraclavicular or axillary adenopathy.      Left upper body: No supraclavicular or axillary adenopathy.     Lower Body: No right inguinal adenopathy. No left inguinal adenopathy.  Skin:    General: Skin is warm.     Coloration: Skin is not jaundiced.     Findings: No lesion or rash.  Neurological:     General: No focal deficit present.     Mental Status: She is alert and oriented to person, place, and time. Mental status is at baseline.  Psychiatric:        Mood and Affect: Mood normal.        Behavior: Behavior normal.        Thought Content: Thought content normal.   LABS:      Latest Ref Rng & Units 02/26/2022   12:00 AM 10/23/2021   12:00 AM 06/26/2021   12:00 AM  CBC  WBC  6.4     6.1     9.7      Hemoglobin 12.0 - 16.0 10.2     9.7     10.8      Hematocrit 36 - 46 32     32     34      Platelets 150 - 400 K/uL 231     259     253  This result is from an external source.      Latest Ref Rng & Units 02/26/2022   12:00 AM 10/23/2021   12:00 AM 06/26/2021   12:00 AM  CMP  BUN 4 - '21 24     31     31      '$ Creatinine 0.5 - 1.1 1.3     1.3     1.3      Sodium 137 - 147 139     141     142      Potassium 3.5 - 5.1 mEq/L 4.1     3.9     3.9      Chloride 99 - 108 99     102     105      CO2 13 - '22 29     26     29      '$ Calcium 8.7 - 10.7 9.8     9.7     9.4      Alkaline Phos 25 - 125 76     75     98      AST 13 - 35 30     32     37      ALT 7 - 35 U/L 44     37     40         This result is from an external source.    Latest Reference Range & Units 02/26/22 08:58  Iron 28 - 170 ug/dL 76  UIBC ug/dL 269  TIBC 250 - 450 ug/dL 345  Saturation Ratios 10.4 - 31.8 % 22  Ferritin 11 - 307 ng/mL 357 (H)  (H): Data is abnormally high  ASSESSMENT & PLAN:  A 66 y.o. female with anemia secondary to previous iron deficiency and chronic renal insufficiency.  I am fine with her hemoglobin being 10 today.  Based upon this, red cell shot therapy can be held.  Her iron parameters today also look fine.  Hematologically, the  patient is doing fine.  I will see her back in 4 months for repeat clinical assessment.  The patient understands all the plans discussed today and is in agreement with them.  Maya Arcand Macarthur Critchley, MD

## 2022-02-26 ENCOUNTER — Inpatient Hospital Stay: Payer: Medicare HMO | Admitting: Oncology

## 2022-02-26 ENCOUNTER — Telehealth: Payer: Self-pay

## 2022-02-26 ENCOUNTER — Inpatient Hospital Stay: Payer: Medicare HMO | Attending: Oncology

## 2022-02-26 ENCOUNTER — Other Ambulatory Visit: Payer: Self-pay | Admitting: Oncology

## 2022-02-26 VITALS — BP 174/78 | HR 95 | Temp 97.9°F | Resp 16 | Ht 64.0 in | Wt 246.3 lb

## 2022-02-26 DIAGNOSIS — N189 Chronic kidney disease, unspecified: Secondary | ICD-10-CM | POA: Diagnosis present

## 2022-02-26 DIAGNOSIS — D649 Anemia, unspecified: Secondary | ICD-10-CM

## 2022-02-26 DIAGNOSIS — D631 Anemia in chronic kidney disease: Secondary | ICD-10-CM | POA: Diagnosis present

## 2022-02-26 DIAGNOSIS — D508 Other iron deficiency anemias: Secondary | ICD-10-CM

## 2022-02-26 LAB — BASIC METABOLIC PANEL
BUN: 24 — AB (ref 4–21)
CO2: 29 — AB (ref 13–22)
Chloride: 99 (ref 99–108)
Creatinine: 1.3 — AB (ref 0.5–1.1)
Glucose: 111
Potassium: 4.1 mEq/L (ref 3.5–5.1)
Sodium: 139 (ref 137–147)

## 2022-02-26 LAB — HEPATIC FUNCTION PANEL
ALT: 44 U/L — AB (ref 7–35)
AST: 30 (ref 13–35)
Alkaline Phosphatase: 76 (ref 25–125)
Bilirubin, Total: 0.6

## 2022-02-26 LAB — CBC AND DIFFERENTIAL
HCT: 32 — AB (ref 36–46)
Hemoglobin: 10.2 — AB (ref 12.0–16.0)
Neutrophils Absolute: 2.75
Platelets: 231 10*3/uL (ref 150–400)
WBC: 6.4

## 2022-02-26 LAB — FERRITIN: Ferritin: 357 ng/mL — ABNORMAL HIGH (ref 11–307)

## 2022-02-26 LAB — IRON AND TIBC
Iron: 76 ug/dL (ref 28–170)
Saturation Ratios: 22 % (ref 10.4–31.8)
TIBC: 345 ug/dL (ref 250–450)
UIBC: 269 ug/dL

## 2022-02-26 LAB — COMPREHENSIVE METABOLIC PANEL
Albumin: 4.3 (ref 3.5–5.0)
Calcium: 9.8 (ref 8.7–10.7)

## 2022-02-26 LAB — CBC: RBC: 3.57 — AB (ref 3.87–5.11)

## 2022-02-26 NOTE — Telephone Encounter (Signed)
Dr Bobby Rumpf states, "tell her all the numbers are fine and I'll see her back in 4 months". Pt notified and verbalized understanding.  Latest Reference Range & Units 02/26/22 00:00 02/26/22 08:58  Sodium 137 - 147  139 (E)   Potassium 3.5 - 5.1 mEq/L 4.1 (E)   Chloride 99 - 108  99 (E)   CO2 13 - 22  29 ! (E)   Glucose  111 (E)   BUN 4 - 21  24 ! (E)   Creatinine 0.5 - 1.1  1.3 ! (E)   Calcium 8.7 - 10.7  9.8 (E)   Alkaline Phosphatase 25 - 125  76 (E)   Albumin 3.5 - 5.0  4.3 (E)   AST 13 - 35  30 (E)   ALT 7 - 35 U/L 44 ! (E)   Bilirubin, Total  0.6 (E)   Iron 28 - 170 ug/dL  76  UIBC ug/dL  269  TIBC 250 - 450 ug/dL  345  Saturation Ratios 10.4 - 31.8 %  22  Ferritin 11 - 307 ng/mL  357 (H)   !: Data is abnormal (H): Data is abnormally high (E): External lab result  Latest Reference Range & Units 02/26/22 00:00  WBC  6.4 (E)  RBC 3.87 - 5.11  3.57 ! (E)  Hemoglobin 12.0 - 16.0  10.2 ! (E)  HCT 36 - 46  32 ! (E)  Platelets 150 - 400 K/uL 231 (E)  NEUT#  2.75 (E)  !: Data is abnormal (E): External lab result

## 2022-02-28 LAB — SOLUBLE TRANSFERRIN RECEPTOR: Transferrin Receptor: 32 nmol/L — ABNORMAL HIGH (ref 12.2–27.3)

## 2022-03-02 ENCOUNTER — Other Ambulatory Visit: Payer: Self-pay | Admitting: Orthopedic Surgery

## 2022-03-02 DIAGNOSIS — M545 Low back pain, unspecified: Secondary | ICD-10-CM

## 2022-03-09 ENCOUNTER — Ambulatory Visit
Admission: RE | Admit: 2022-03-09 | Discharge: 2022-03-09 | Disposition: A | Discharge status: Home / Self Care | Payer: Medicare HMO | Source: Ambulatory Visit | Attending: Orthopedic Surgery | Admitting: Orthopedic Surgery

## 2022-03-09 DIAGNOSIS — M545 Low back pain, unspecified: Secondary | ICD-10-CM

## 2022-03-09 MED ORDER — METHYLPREDNISOLONE ACETATE 40 MG/ML INJ SUSP (RADIOLOG
80.0000 mg | Freq: Once | INTRAMUSCULAR | Status: AC
  Administered 2022-03-09: 80 mg via EPIDURAL

## 2022-03-09 MED ORDER — IOPAMIDOL (ISOVUE-M 200) INJECTION 41%
1.0000 mL | Freq: Once | INTRAMUSCULAR | Status: AC
  Administered 2022-03-09: 1 mL via EPIDURAL

## 2022-03-09 NOTE — Discharge Instructions (Signed)

## 2022-06-21 ENCOUNTER — Encounter: Payer: Self-pay | Admitting: Oncology

## 2022-06-25 ENCOUNTER — Inpatient Hospital Stay: Payer: PPO | Attending: Oncology

## 2022-06-25 ENCOUNTER — Encounter: Payer: Self-pay | Admitting: Oncology

## 2022-06-25 DIAGNOSIS — D631 Anemia in chronic kidney disease: Secondary | ICD-10-CM | POA: Diagnosis not present

## 2022-06-25 DIAGNOSIS — D509 Iron deficiency anemia, unspecified: Secondary | ICD-10-CM | POA: Insufficient documentation

## 2022-06-25 DIAGNOSIS — D508 Other iron deficiency anemias: Secondary | ICD-10-CM

## 2022-06-25 DIAGNOSIS — N189 Chronic kidney disease, unspecified: Secondary | ICD-10-CM | POA: Diagnosis not present

## 2022-06-25 LAB — CBC WITH DIFFERENTIAL (CANCER CENTER ONLY)
Abs Immature Granulocytes: 0.05 10*3/uL (ref 0.00–0.07)
Basophils Absolute: 0 10*3/uL (ref 0.0–0.1)
Basophils Relative: 1 %
Eosinophils Absolute: 0.2 10*3/uL (ref 0.0–0.5)
Eosinophils Relative: 4 %
HCT: 34.4 % — ABNORMAL LOW (ref 36.0–46.0)
Hemoglobin: 10.2 g/dL — ABNORMAL LOW (ref 12.0–15.0)
Immature Granulocytes: 1 %
Lymphocytes Relative: 24 %
Lymphs Abs: 1.5 10*3/uL (ref 0.7–4.0)
MCH: 28.2 pg (ref 26.0–34.0)
MCHC: 29.7 g/dL — ABNORMAL LOW (ref 30.0–36.0)
MCV: 95 fL (ref 80.0–100.0)
Monocytes Absolute: 0.7 10*3/uL (ref 0.1–1.0)
Monocytes Relative: 12 %
Neutro Abs: 3.6 10*3/uL (ref 1.7–7.7)
Neutrophils Relative %: 58 %
Platelet Count: 264 10*3/uL (ref 150–400)
RBC: 3.62 MIL/uL — ABNORMAL LOW (ref 3.87–5.11)
RDW: 13.3 % (ref 11.5–15.5)
WBC Count: 6.1 10*3/uL (ref 4.0–10.5)
nRBC: 0 % (ref 0.0–0.2)

## 2022-06-25 LAB — FOLATE: Folate: 19.2 ng/mL (ref >5.9)

## 2022-06-25 LAB — VITAMIN B12: Vitamin B-12: 483 pg/mL (ref 180–914)

## 2022-06-25 LAB — CMP (CANCER CENTER ONLY)
ALT: 34 U/L (ref 0–44)
AST: 34 U/L (ref 15–41)
Albumin: 4.7 g/dL (ref 3.5–5.0)
Alkaline Phosphatase: 44 U/L (ref 38–126)
Anion gap: 12 (ref 5–15)
BUN: 17 mg/dL (ref 8–23)
CO2: 24 mmol/L (ref 22–32)
Calcium: 9.6 mg/dL (ref 8.9–10.3)
Chloride: 103 mmol/L (ref 98–111)
Creatinine: 1.21 mg/dL — ABNORMAL HIGH (ref 0.44–1.00)
GFR, Estimated: 49 mL/min — ABNORMAL LOW (ref >60)
Glucose, Bld: 147 mg/dL — ABNORMAL HIGH (ref 70–99)
Potassium: 3.5 mmol/L (ref 3.5–5.1)
Sodium: 139 mmol/L (ref 135–145)
Total Bilirubin: 0.5 mg/dL (ref 0.3–1.2)
Total Protein: 6.8 g/dL (ref 6.5–8.1)

## 2022-06-25 LAB — IRON AND TIBC
Iron: 93 ug/dL (ref 28–170)
Saturation Ratios: 24 % (ref 10.4–31.8)
TIBC: 382 ug/dL (ref 250–450)
UIBC: 289 ug/dL

## 2022-06-25 LAB — FERRITIN: Ferritin: 477 ng/mL — ABNORMAL HIGH (ref 11–307)

## 2022-06-28 ENCOUNTER — Other Ambulatory Visit: Payer: Self-pay | Admitting: Oncology

## 2022-06-28 ENCOUNTER — Inpatient Hospital Stay: Payer: Self-pay

## 2022-06-28 ENCOUNTER — Inpatient Hospital Stay (INDEPENDENT_AMBULATORY_CARE_PROVIDER_SITE_OTHER): Payer: PPO | Admitting: Oncology

## 2022-06-28 VITALS — BP 206/91 | HR 102 | Temp 98.8°F | Resp 16 | Ht 64.0 in | Wt 240.0 lb

## 2022-06-28 DIAGNOSIS — D508 Other iron deficiency anemias: Secondary | ICD-10-CM

## 2022-06-28 NOTE — Progress Notes (Signed)
[facility]  [address] [place],  [place]  [address] [phone]  Clinic Day:  06/28/2022  Referring physician: Gala Lewandowsky, MD  HISTORY OF PRESENT ILLNESS:  The patient is a 67 y.o. female with anemia secondary to previous iron deficiency, as well as chronic renal insufficiency.  She comes in today to reassess her anemia.  Since her last visit, the patient has been doing fine.  She denies having increased fatigue or any overt forms of blood loss which concern her for progressive anemia.    PHYSICAL EXAM:  Blood pressure (!) 206/91, pulse (!) 102, temperature 98.8 F (37.1 C), resp. rate 16, height '5\' 4"'$  (1.626 m), weight 240 lb (108.9 kg), SpO2 90 %. Wt Readings from Last 3 Encounters:  06/28/22 240 lb (108.9 kg)  02/26/22 246 lb 4.8 oz (111.7 kg)  10/28/21 255 lb 2 oz (115.7 kg)   Body mass index is 41.2 kg/m. Performance status (ECOG): 1 - Symptomatic but completely ambulatory Physical Exam Constitutional:      Appearance: Normal appearance. She is not ill-appearing.  HENT:     Mouth/Throat:     Mouth: Mucous membranes are moist.     Pharynx: Oropharynx is clear. No oropharyngeal exudate or posterior oropharyngeal erythema.  Cardiovascular:     Rate and Rhythm: Normal rate and regular rhythm.     Heart sounds: No murmur heard.    No friction rub. No gallop.  Pulmonary:     Effort: Pulmonary effort is normal. No respiratory distress.     Breath sounds: Normal breath sounds. No wheezing, rhonchi or rales.  Abdominal:     General: Bowel sounds are normal. There is no distension.     Palpations: Abdomen is soft. There is no mass.     Tenderness: There is no abdominal tenderness.  Musculoskeletal:        General: No swelling.     Right lower leg: No edema.     Left lower leg: No edema.  Lymphadenopathy:     Cervical: No cervical adenopathy.     Upper Body:     Right upper body: No supraclavicular or axillary adenopathy.     Left  upper body: No supraclavicular or axillary adenopathy.     Lower Body: No right inguinal adenopathy. No left inguinal adenopathy.  Skin:    General: Skin is warm.     Coloration: Skin is not jaundiced.     Findings: No lesion or rash.  Neurological:     General: No focal deficit present.     Mental Status: She is alert and oriented to person, place, and time. Mental status is at baseline.  Psychiatric:        Mood and Affect: Mood normal.        Behavior: Behavior normal.        Thought Content: Thought content normal.    LABS:      Latest Ref Rng & Units 06/25/2022    3:25 PM 02/26/2022   12:00 AM 10/23/2021   12:00 AM  CBC  WBC 4.0 - 10.5 K/uL 6.1  6.4     6.1      Hemoglobin 12.0 - 15.0 g/dL 10.2  10.2     9.7      Hematocrit 36.0 - 46.0 % 34.4  32     32      Platelets 150 - 400 K/uL 264  231     259  This result is from an external source.      Latest Ref Rng & Units 06/25/2022    3:25 PM 02/26/2022   12:00 AM 10/23/2021   12:00 AM  CMP  Glucose 70 - 99 mg/dL 147     BUN 8 - 23 mg/dL '17  24     31      '$ Creatinine 0.44 - 1.00 mg/dL 1.21  1.3     1.3      Sodium 135 - 145 mmol/L 139  139     141      Potassium 3.5 - 5.1 mmol/L 3.5  4.1     3.9      Chloride 98 - 111 mmol/L 103  99     102      CO2 22 - 32 mmol/L '24  29     26      '$ Calcium 8.9 - 10.3 mg/dL 9.6  9.8     9.7      Total Protein 6.5 - 8.1 g/dL 6.8     Total Bilirubin 0.3 - 1.2 mg/dL 0.5     Alkaline Phos 38 - 126 U/L 44  76     75      AST 15 - 41 U/L 34  30     32      ALT 0 - 44 U/L 34  44     37         This result is from an external source.    Latest Reference Range & Units 06/25/22 15:24 06/25/22 15:25  Iron 28 - 170 ug/dL 93   UIBC ug/dL 289   TIBC 250 - 450 ug/dL 382   Saturation Ratios 10.4 - 31.8 % 24   Ferritin 11 - 307 ng/mL 477 (H)   Folate >5.9 ng/mL  19.2  Vitamin B12 180 - 914 pg/mL 483   (H): Data is abnormally high  ASSESSMENT & PLAN:  A 67 y.o. female with anemia secondary to  previous iron deficiency and chronic renal insufficiency.  I am pleased as her hemoglobin remains above 10.  Based upon this, red cell shot therapy will continue to be held.  Her iron , B12 and folate levels also remain normal.  Hematologically, the patient is doing fine.  I will see her back in 6 months for repeat clinical assessment.  The patient understands all the plans discussed today and is in agreement with them.  Nakya Weyand Macarthur Critchley, MD

## 2022-09-01 DIAGNOSIS — R079 Chest pain, unspecified: Secondary | ICD-10-CM | POA: Diagnosis not present

## 2022-09-02 ENCOUNTER — Encounter: Payer: Self-pay | Admitting: Oncology

## 2022-09-29 ENCOUNTER — Encounter: Payer: Self-pay | Admitting: Oncology

## 2022-12-26 NOTE — Progress Notes (Unsigned)
[facility]  [address] [place],  [place]  [address] [phone]  Clinic Day:  12/28/2022  Referring physician: Maris Berger, MD  HISTORY OF PRESENT ILLNESS:  The patient is a 67 y.o. female with anemia secondary to previous iron deficiency, as well as chronic renal insufficiency.  She comes in today to reassess her anemia.  Since her last visit, the patient has been doing fine.  She denies having increased fatigue or any overt forms of blood loss which concern her for progressive anemia.    PHYSICAL EXAM:  Blood pressure (!) 177/76, pulse 79, temperature 98.6 F (37 C), resp. rate 16, height 5\' 4"  (1.626 m), weight 250 lb 4.8 oz (113.5 kg), SpO2 91 %. Wt Readings from Last 3 Encounters:  12/27/22 250 lb 4.8 oz (113.5 kg)  06/28/22 240 lb (108.9 kg)  02/26/22 246 lb 4.8 oz (111.7 kg)   Body mass index is 42.96 kg/m. Performance status (ECOG): 1 - Symptomatic but completely ambulatory Physical Exam Constitutional:      Appearance: Normal appearance. She is not ill-appearing.  HENT:     Mouth/Throat:     Mouth: Mucous membranes are moist.     Pharynx: Oropharynx is clear. No oropharyngeal exudate or posterior oropharyngeal erythema.  Cardiovascular:     Rate and Rhythm: Normal rate and regular rhythm.     Heart sounds: No murmur heard.    No friction rub. No gallop.  Pulmonary:     Effort: Pulmonary effort is normal. No respiratory distress.     Breath sounds: Normal breath sounds. No wheezing, rhonchi or rales.  Abdominal:     General: Bowel sounds are normal. There is no distension.     Palpations: Abdomen is soft. There is no mass.     Tenderness: There is no abdominal tenderness.  Musculoskeletal:        General: No swelling.     Right lower leg: No edema.     Left lower leg: No edema.  Lymphadenopathy:     Cervical: No cervical adenopathy.     Upper Body:     Right upper body: No supraclavicular or axillary adenopathy.      Left upper body: No supraclavicular or axillary adenopathy.     Lower Body: No right inguinal adenopathy. No left inguinal adenopathy.  Skin:    General: Skin is warm.     Coloration: Skin is not jaundiced.     Findings: No lesion or rash.  Neurological:     General: No focal deficit present.     Mental Status: She is alert and oriented to person, place, and time. Mental status is at baseline.  Psychiatric:        Mood and Affect: Mood normal.        Behavior: Behavior normal.        Thought Content: Thought content normal.    LABS:        Latest Ref Rng & Units 12/27/2022   12:00 AM 06/25/2022    3:25 PM 02/26/2022   12:00 AM  CBC  WBC  6.4     6.1  6.4      Hemoglobin 12.0 - 16.0 10.8     10.2  10.2      Hematocrit 36 - 46 34     34.4  32      Platelets 150 - 400 K/uL 277     264  231  This result is from an external source.      Latest Ref Rng & Units 12/27/2022    1:24 PM 06/25/2022    3:25 PM 02/26/2022   12:00 AM  CMP  Glucose 70 - 99 mg/dL 161  096    BUN 8 - 23 mg/dL 21  17  24       Creatinine 0.44 - 1.00 mg/dL 0.45  4.09  1.3      Sodium 135 - 145 mmol/L 140  139  139      Potassium 3.5 - 5.1 mmol/L 4.2  3.5  4.1      Chloride 98 - 111 mmol/L 104  103  99      CO2 22 - 32 mmol/L 27  24  29       Calcium 8.9 - 10.3 mg/dL 81.1  9.6  9.8      Total Protein 6.5 - 8.1 g/dL 7.1  6.8    Total Bilirubin 0.3 - 1.2 mg/dL 0.4  0.5    Alkaline Phos 38 - 126 U/L 51  44  76      AST 15 - 41 U/L 24  34  30      ALT 0 - 44 U/L 39  34  44         This result is from an external source.    Latest Reference Range & Units 12/27/22 13:23  Iron 28 - 170 ug/dL 87  UIBC ug/dL 914  TIBC 782 - 956 ug/dL 213  Saturation Ratios 10.4 - 31.8 % 25  Ferritin 11 - 307 ng/mL 275   ASSESSMENT & PLAN:  A 67 y.o. female with anemia secondary to previous iron deficiency and chronic renal insufficiency.  I am pleased as her hemoglobin remains above 10.  Based upon this, red cell shot  therapy will continue to be held.  Her iron levels also remain normal.  Hematologically, the patient is doing fine.  I will see her back in 6 months for repeat clinical assessment.  The patient understands all the plans discussed today and is in agreement with them.  Kjirsten Bloodgood Kirby Funk, MD

## 2022-12-27 ENCOUNTER — Inpatient Hospital Stay: Payer: HMO

## 2022-12-27 ENCOUNTER — Inpatient Hospital Stay: Payer: HMO | Attending: Oncology | Admitting: Oncology

## 2022-12-27 ENCOUNTER — Other Ambulatory Visit: Payer: Self-pay | Admitting: Oncology

## 2022-12-27 ENCOUNTER — Telehealth: Payer: Self-pay | Admitting: Oncology

## 2022-12-27 VITALS — BP 177/76 | HR 79 | Temp 98.6°F | Resp 16 | Ht 64.0 in | Wt 250.3 lb

## 2022-12-27 DIAGNOSIS — D508 Other iron deficiency anemias: Secondary | ICD-10-CM

## 2022-12-27 DIAGNOSIS — D509 Iron deficiency anemia, unspecified: Secondary | ICD-10-CM | POA: Insufficient documentation

## 2022-12-27 DIAGNOSIS — D631 Anemia in chronic kidney disease: Secondary | ICD-10-CM | POA: Insufficient documentation

## 2022-12-27 DIAGNOSIS — D649 Anemia, unspecified: Secondary | ICD-10-CM

## 2022-12-27 DIAGNOSIS — N189 Chronic kidney disease, unspecified: Secondary | ICD-10-CM | POA: Insufficient documentation

## 2022-12-27 LAB — CBC AND DIFFERENTIAL
HCT: 34 — AB (ref 36–46)
Hemoglobin: 10.8 — AB (ref 12.0–16.0)
Neutrophils Absolute: 3.26
Platelets: 277 10*3/uL (ref 150–400)
WBC: 6.4

## 2022-12-27 LAB — IRON AND TIBC
Iron: 87 ug/dL (ref 28–170)
Saturation Ratios: 25 % (ref 10.4–31.8)
TIBC: 351 ug/dL (ref 250–450)
UIBC: 264 ug/dL

## 2022-12-27 LAB — CMP (CANCER CENTER ONLY)
ALT: 39 U/L (ref 0–44)
AST: 24 U/L (ref 15–41)
Albumin: 4.2 g/dL (ref 3.5–5.0)
Alkaline Phosphatase: 51 U/L (ref 38–126)
Anion gap: 9 (ref 5–15)
BUN: 21 mg/dL (ref 8–23)
CO2: 27 mmol/L (ref 22–32)
Calcium: 10.1 mg/dL (ref 8.9–10.3)
Chloride: 104 mmol/L (ref 98–111)
Creatinine: 1.12 mg/dL — ABNORMAL HIGH (ref 0.44–1.00)
GFR, Estimated: 54 mL/min — ABNORMAL LOW (ref >60)
Glucose, Bld: 111 mg/dL — ABNORMAL HIGH (ref 70–99)
Potassium: 4.2 mmol/L (ref 3.5–5.1)
Sodium: 140 mmol/L (ref 135–145)
Total Bilirubin: 0.4 mg/dL (ref 0.3–1.2)
Total Protein: 7.1 g/dL (ref 6.5–8.1)

## 2022-12-27 LAB — CBC: RBC: 3.78 — AB (ref 3.87–5.11)

## 2022-12-27 LAB — FERRITIN: Ferritin: 275 ng/mL (ref 11–307)

## 2022-12-27 NOTE — Telephone Encounter (Signed)
12/27/22 Next appt scheduled and confirmed with patient

## 2022-12-27 NOTE — Progress Notes (Incomplete)
[facility]  [address] [place],  [place]  [address] [phone]  Clinic Day:  12/27/2022  Referring physician: Maris Berger, MD  HISTORY OF PRESENT ILLNESS:  The patient is a 67 y.o. female with anemia secondary to previous iron deficiency, as well as chronic renal insufficiency.  She comes in today to reassess her anemia.  Since her last visit, the patient has been doing fine.  She denies having increased fatigue or any overt forms of blood loss which concern her for progressive anemia.    PHYSICAL EXAM:  Blood pressure (!) 177/76, pulse 79, temperature 98.6 F (37 C), resp. rate 16, height 5\' 4"  (1.626 m), weight 250 lb 4.8 oz (113.5 kg), SpO2 91 %. Wt Readings from Last 3 Encounters:  12/27/22 250 lb 4.8 oz (113.5 kg)  06/28/22 240 lb (108.9 kg)  02/26/22 246 lb 4.8 oz (111.7 kg)   Body mass index is 42.96 kg/m. Performance status (ECOG): 1 - Symptomatic but completely ambulatory Physical Exam Constitutional:      Appearance: Normal appearance. She is not ill-appearing.  HENT:     Mouth/Throat:     Mouth: Mucous membranes are moist.     Pharynx: Oropharynx is clear. No oropharyngeal exudate or posterior oropharyngeal erythema.  Cardiovascular:     Rate and Rhythm: Normal rate and regular rhythm.     Heart sounds: No murmur heard.    No friction rub. No gallop.  Pulmonary:     Effort: Pulmonary effort is normal. No respiratory distress.     Breath sounds: Normal breath sounds. No wheezing, rhonchi or rales.  Abdominal:     General: Bowel sounds are normal. There is no distension.     Palpations: Abdomen is soft. There is no mass.     Tenderness: There is no abdominal tenderness.  Musculoskeletal:        General: No swelling.     Right lower leg: No edema.     Left lower leg: No edema.  Lymphadenopathy:     Cervical: No cervical adenopathy.     Upper Body:     Right upper body: No supraclavicular or axillary adenopathy.      Left upper body: No supraclavicular or axillary adenopathy.     Lower Body: No right inguinal adenopathy. No left inguinal adenopathy.  Skin:    General: Skin is warm.     Coloration: Skin is not jaundiced.     Findings: No lesion or rash.  Neurological:     General: No focal deficit present.     Mental Status: She is alert and oriented to person, place, and time. Mental status is at baseline.  Psychiatric:        Mood and Affect: Mood normal.        Behavior: Behavior normal.        Thought Content: Thought content normal.    LABS:        Latest Ref Rng & Units 12/27/2022   12:00 AM 06/25/2022    3:25 PM 02/26/2022   12:00 AM  CBC  WBC  6.4     6.1  6.4      Hemoglobin 12.0 - 16.0 10.8     10.2  10.2      Hematocrit 36 - 46 34     34.4  32      Platelets 150 - 400 K/uL 277     264  231  This result is from an external source.      Latest Ref Rng & Units 06/25/2022    3:25 PM 02/26/2022   12:00 AM 10/23/2021   12:00 AM  CMP  Glucose 70 - 99 mg/dL 161     BUN 8 - 23 mg/dL 17  24     31       Creatinine 0.44 - 1.00 mg/dL 0.96  1.3     1.3      Sodium 135 - 145 mmol/L 139  139     141      Potassium 3.5 - 5.1 mmol/L 3.5  4.1     3.9      Chloride 98 - 111 mmol/L 103  99     102      CO2 22 - 32 mmol/L 24  29     26       Calcium 8.9 - 10.3 mg/dL 9.6  9.8     9.7      Total Protein 6.5 - 8.1 g/dL 6.8     Total Bilirubin 0.3 - 1.2 mg/dL 0.5     Alkaline Phos 38 - 126 U/L 44  76     75      AST 15 - 41 U/L 34  30     32      ALT 0 - 44 U/L 34  44     37         This result is from an external source.   Iron studies pending   ASSESSMENT & PLAN:  A 67 y.o. female with anemia secondary to previous iron deficiency and chronic renal insufficiency.  I am pleased as her hemoglobin remains above 10.  Based upon this, red cell shot therapy will continue to be held.  Her iron levels also remain normal.  Hematologically, the patient is doing fine.  I will see her back in 6 months for  repeat clinical assessment.  The patient understands all the plans discussed today and is in agreement with them.  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  Unfortunately, CT scans done in late June 2020 showed evidence of disease progression Ascites being appreciated.  The patient did have 3 L of fluid, workup regularly does come back highly worrisome for malignant ascites.  This patient's disease just recently progressed after receiving 12 cycles of FOLFIRINOX/FOLFIRI.  Unfortunately, over these past few weeks, the patient has had  For reevaluation of ascites.  While in [place], [place], late last week, 6 L of ascites was removed from an emergency room visit.  Despite this, the patient became very uncomfortable thereafter.  On which back down to [place], in addition to having a lot of abdominal discomfort, she began having severe abdominal pain, as well as altered mental status.  The rapid decline in her health/her family bring her to the emergency room last night to be further evaluated. 78 pancreatitis, as well as omental caking consistent with worsening metastatic disease.  The patient was admitted for further evaluation early this morning.

## 2022-12-28 ENCOUNTER — Encounter: Payer: Self-pay | Admitting: Oncology

## 2023-06-29 NOTE — Progress Notes (Signed)
 [facility]  [address] [place],  [place]  [address] [phone]  Clinic Day:  06/30/2023  Referring physician: Marelyn Quill, MD  HISTORY OF PRESENT ILLNESS:  The patient is a 68 y.o. female with anemia secondary to previous iron  deficiency, as well as chronic renal insufficiency.  She comes in today to reassess her anemia.  Since her last visit, the patient has been doing fine.  She denies having increased fatigue or any overt forms of blood loss which concern her for progressive anemia.    PHYSICAL EXAM:  Blood pressure (!) 183/82, pulse 79, temperature 98.9 F (37.2 C), temperature source Oral, resp. rate 14, height 5' 4 (1.626 m), weight 244 lb (110.7 kg), SpO2 94%. Wt Readings from Last 3 Encounters:  06/30/23 244 lb (110.7 kg)  12/27/22 250 lb 4.8 oz (113.5 kg)  06/28/22 240 lb (108.9 kg)   Body mass index is 41.88 kg/m. Performance status (ECOG): 1 - Symptomatic but completely ambulatory Physical Exam Constitutional:      Appearance: Normal appearance. She is not ill-appearing.  HENT:     Mouth/Throat:     Mouth: Mucous membranes are moist.     Pharynx: Oropharynx is clear. No oropharyngeal exudate or posterior oropharyngeal erythema.  Cardiovascular:     Rate and Rhythm: Normal rate and regular rhythm.     Heart sounds: No murmur heard.    No friction rub. No gallop.  Pulmonary:     Effort: Pulmonary effort is normal. No respiratory distress.     Breath sounds: Normal breath sounds. No wheezing, rhonchi or rales.  Abdominal:     General: Bowel sounds are normal. There is no distension.     Palpations: Abdomen is soft. There is no mass.     Tenderness: There is no abdominal tenderness.  Musculoskeletal:        General: No swelling.     Right lower leg: No edema.     Left lower leg: No edema.  Lymphadenopathy:     Cervical: No cervical adenopathy.     Upper Body:     Right upper body: No supraclavicular or axillary  adenopathy.     Left upper body: No supraclavicular or axillary adenopathy.     Lower Body: No right inguinal adenopathy. No left inguinal adenopathy.  Skin:    General: Skin is warm.     Coloration: Skin is not jaundiced.     Findings: No lesion or rash.  Neurological:     General: No focal deficit present.     Mental Status: She is alert and oriented to person, place, and time. Mental status is at baseline.  Psychiatric:        Mood and Affect: Mood normal.        Behavior: Behavior normal.        Thought Content: Thought content normal.    LABS:      Latest Ref Rng & Units 06/30/2023    1:10 PM 12/27/2022   12:00 AM 06/25/2022    3:25 PM  CBC  WBC 4.0 - 10.5 K/uL 9.3  6.4     6.1   Hemoglobin 12.0 - 15.0 g/dL 89.1  89.1     89.7   Hematocrit 36.0 - 46.0 % 34.4  34     34.4   Platelets 150 - 400 K/uL 288  277     264      This result is from an external source.  Latest Ref Rng & Units 06/30/2023    1:10 PM 12/27/2022    1:24 PM 06/25/2022    3:25 PM  CMP  Glucose 70 - 99 mg/dL 79  888  852   BUN 8 - 23 mg/dL 30  21  17    Creatinine 0.44 - 1.00 mg/dL 8.92  8.87  8.78   Sodium 135 - 145 mmol/L 143  140  139   Potassium 3.5 - 5.1 mmol/L 3.8  4.2  3.5   Chloride 98 - 111 mmol/L 103  104  103   CO2 22 - 32 mmol/L 27  27  24    Calcium 8.9 - 10.3 mg/dL 89.5  89.8  9.6   Total Protein 6.5 - 8.1 g/dL 7.0  7.1  6.8   Total Bilirubin 0.0 - 1.2 mg/dL 0.3  0.4  0.5   Alkaline Phos 38 - 126 U/L 112  51  44   AST 15 - 41 U/L 29  24  34   ALT 0 - 44 U/L 51  39  34     Latest Reference Range & Units 06/30/23 13:10  Iron  28 - 170 ug/dL 94  UIBC ug/dL 698  TIBC 749 - 549 ug/dL 604  Saturation Ratios 10.4 - 31.8 % 24  Ferritin 11 - 307 ng/mL 282   ASSESSMENT & PLAN:  A 68 y.o. female with anemia secondary to previous iron  deficiency and chronic renal insufficiency.  I am pleased as her hemoglobin remains above 10.  Based upon this, red cell shot therapy will continue to be held.  Her  iron  levels also remain normal.  Hematologically, the patient is holding stable.  Based upon this, I will see her back in 6 months for repeat clinical assessment.  The patient understands all the plans discussed today and is in agreement with them.  Stefanie Campbell Stefanie Kerns, MD

## 2023-06-30 ENCOUNTER — Inpatient Hospital Stay: Payer: HMO

## 2023-06-30 ENCOUNTER — Other Ambulatory Visit: Payer: Self-pay | Admitting: Oncology

## 2023-06-30 ENCOUNTER — Inpatient Hospital Stay: Payer: HMO | Attending: Oncology | Admitting: Oncology

## 2023-06-30 VITALS — BP 183/82 | HR 79 | Temp 98.9°F | Resp 14 | Ht 64.0 in | Wt 244.0 lb

## 2023-06-30 DIAGNOSIS — D649 Anemia, unspecified: Secondary | ICD-10-CM

## 2023-06-30 DIAGNOSIS — N189 Chronic kidney disease, unspecified: Secondary | ICD-10-CM | POA: Diagnosis not present

## 2023-06-30 DIAGNOSIS — D631 Anemia in chronic kidney disease: Secondary | ICD-10-CM | POA: Insufficient documentation

## 2023-06-30 DIAGNOSIS — D508 Other iron deficiency anemias: Secondary | ICD-10-CM | POA: Diagnosis not present

## 2023-06-30 DIAGNOSIS — D509 Iron deficiency anemia, unspecified: Secondary | ICD-10-CM | POA: Diagnosis present

## 2023-06-30 LAB — CBC WITH DIFFERENTIAL (CANCER CENTER ONLY)
Abs Immature Granulocytes: 0.07 10*3/uL (ref 0.00–0.07)
Basophils Absolute: 0.1 10*3/uL (ref 0.0–0.1)
Basophils Relative: 1 %
Eosinophils Absolute: 0.3 10*3/uL (ref 0.0–0.5)
Eosinophils Relative: 3 %
HCT: 34.4 % — ABNORMAL LOW (ref 36.0–46.0)
Hemoglobin: 10.8 g/dL — ABNORMAL LOW (ref 12.0–15.0)
Immature Granulocytes: 1 %
Lymphocytes Relative: 33 %
Lymphs Abs: 3.1 10*3/uL (ref 0.7–4.0)
MCH: 29 pg (ref 26.0–34.0)
MCHC: 31.4 g/dL (ref 30.0–36.0)
MCV: 92.2 fL (ref 80.0–100.0)
Monocytes Absolute: 1.2 10*3/uL — ABNORMAL HIGH (ref 0.1–1.0)
Monocytes Relative: 12 %
Neutro Abs: 4.6 10*3/uL (ref 1.7–7.7)
Neutrophils Relative %: 50 %
Platelet Count: 288 10*3/uL (ref 150–400)
RBC: 3.73 MIL/uL — ABNORMAL LOW (ref 3.87–5.11)
RDW: 13.7 % (ref 11.5–15.5)
WBC Count: 9.3 10*3/uL (ref 4.0–10.5)
nRBC: 0 % (ref 0.0–0.2)
nRBC: 0 /100{WBCs}

## 2023-06-30 LAB — IRON AND TIBC
Iron: 94 ug/dL (ref 28–170)
Saturation Ratios: 24 % (ref 10.4–31.8)
TIBC: 395 ug/dL (ref 250–450)
UIBC: 301 ug/dL

## 2023-06-30 LAB — CMP (CANCER CENTER ONLY)
ALT: 51 U/L — ABNORMAL HIGH (ref 0–44)
AST: 29 U/L (ref 15–41)
Albumin: 4.7 g/dL (ref 3.5–5.0)
Alkaline Phosphatase: 112 U/L (ref 38–126)
Anion gap: 13 (ref 5–15)
BUN: 30 mg/dL — ABNORMAL HIGH (ref 8–23)
CO2: 27 mmol/L (ref 22–32)
Calcium: 10.4 mg/dL — ABNORMAL HIGH (ref 8.9–10.3)
Chloride: 103 mmol/L (ref 98–111)
Creatinine: 1.07 mg/dL — ABNORMAL HIGH (ref 0.44–1.00)
GFR, Estimated: 57 mL/min — ABNORMAL LOW (ref >60)
Glucose, Bld: 79 mg/dL (ref 70–99)
Potassium: 3.8 mmol/L (ref 3.5–5.1)
Sodium: 143 mmol/L (ref 135–145)
Total Bilirubin: 0.3 mg/dL (ref 0.0–1.2)
Total Protein: 7 g/dL (ref 6.5–8.1)

## 2023-06-30 LAB — FERRITIN: Ferritin: 282 ng/mL (ref 11–307)

## 2023-07-03 ENCOUNTER — Encounter: Payer: Self-pay | Admitting: Oncology

## 2023-08-08 ENCOUNTER — Other Ambulatory Visit: Payer: Self-pay | Admitting: Orthopedic Surgery

## 2023-08-08 DIAGNOSIS — M545 Low back pain, unspecified: Secondary | ICD-10-CM

## 2023-08-15 IMAGING — US US LIVER ELASTOGRAPHY
1 series · 12 of 25 positions shown · non-contrast
Comparison: Ultrasound abdomen 08/07/2021

CLINICAL DATA: Fatty liver, abnormal LFTs

EXAM:
US LIVER ELASTOGRAPHY
TECHNIQUE: Sonography of the liver was performed. In addition, ultrasound
elastography evaluation of the liver was performed. A region of
interest was placed within the right lobe of the liver. Following
application of a compressive sonographic pulse, tissue
compressibility was assessed. Multiple assessments were performed at
the selected site. Median tissue compressibility was determined.
Previously, hepatic stiffness was assessed by shear wave velocity.
Based on recently published Society of Radiologists in Ultrasound
consensus article, reporting is now recommended to be performed in
the SI units of pressure (kiloPascals) representing hepatic
stiffness/elasticity. The obtained result is compared to the
published reference standards. (cACLD = compensated Advanced Chronic
Liver Disease)

[Series 1: us elastography liver · 12 of 31 slices shown]
[im 2/31]
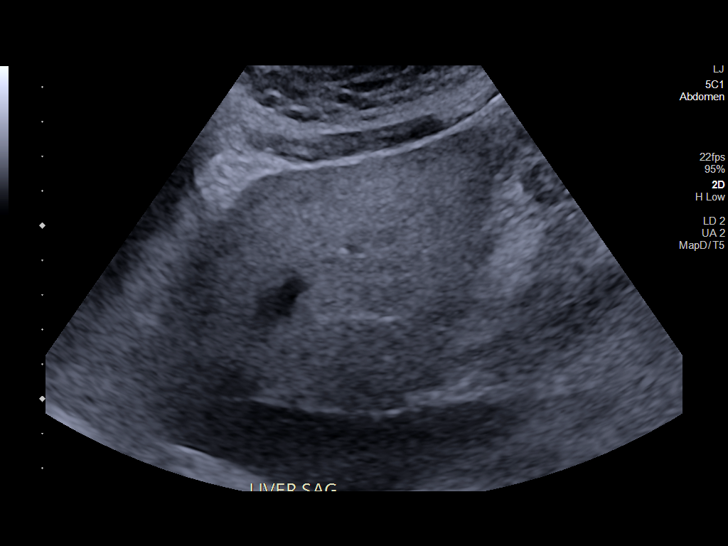
[im 4/31]
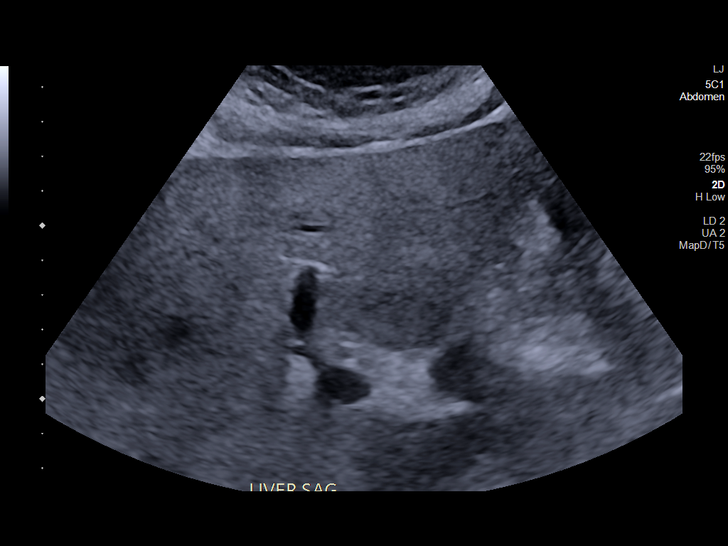
[im 7/31]
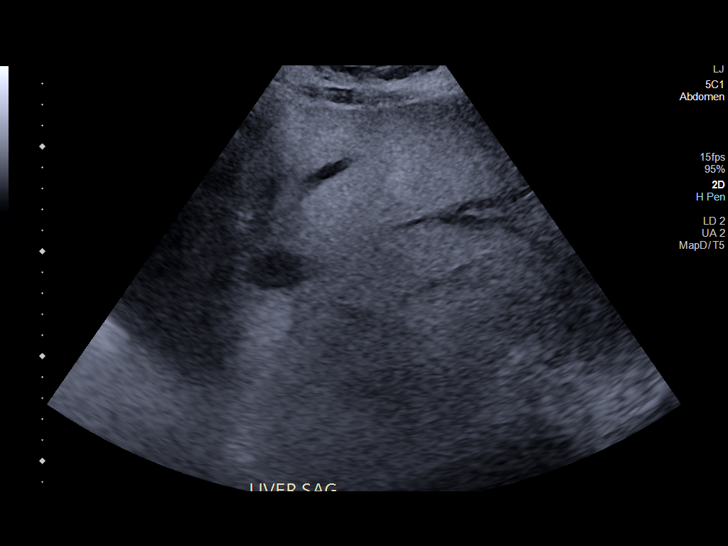
[im 9/31]
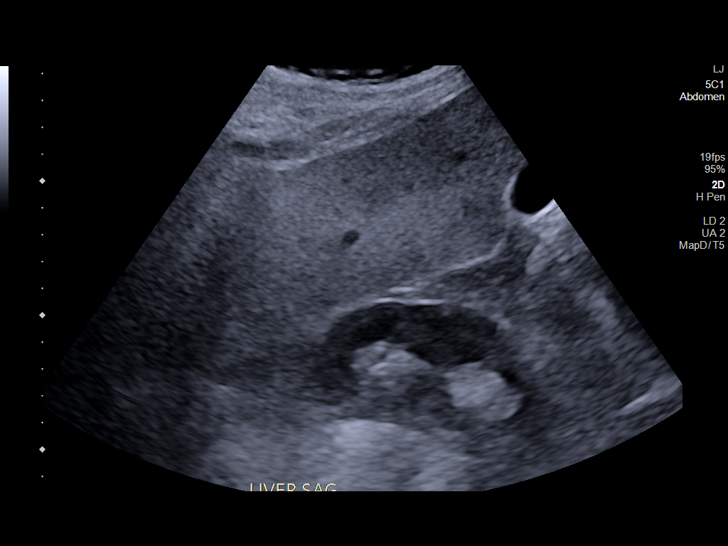
[im 12/31]
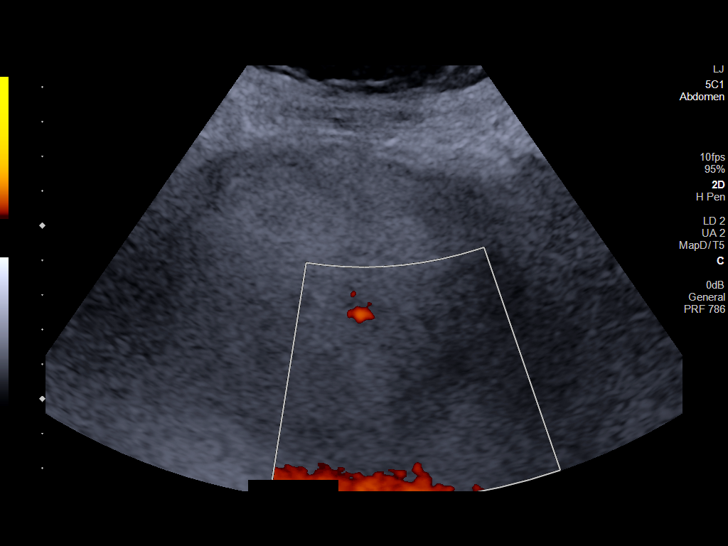
[im 14/31]
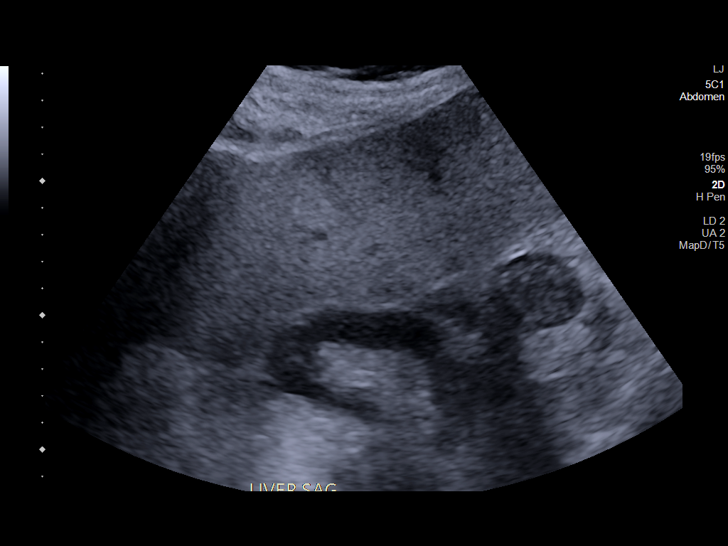
[im 17/31]
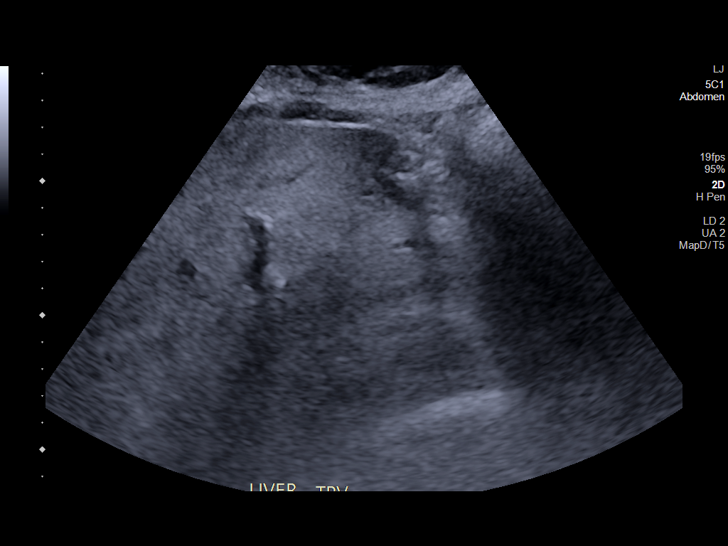
[im 19/31]
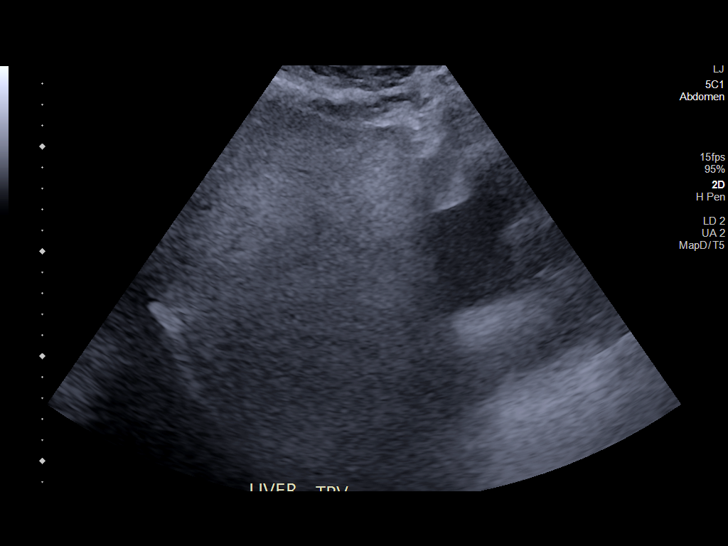
[im 22/31]
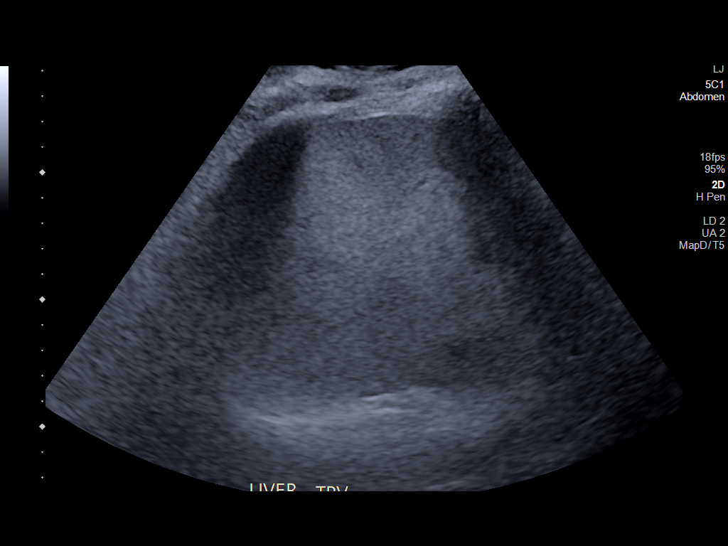
[im 24/31]
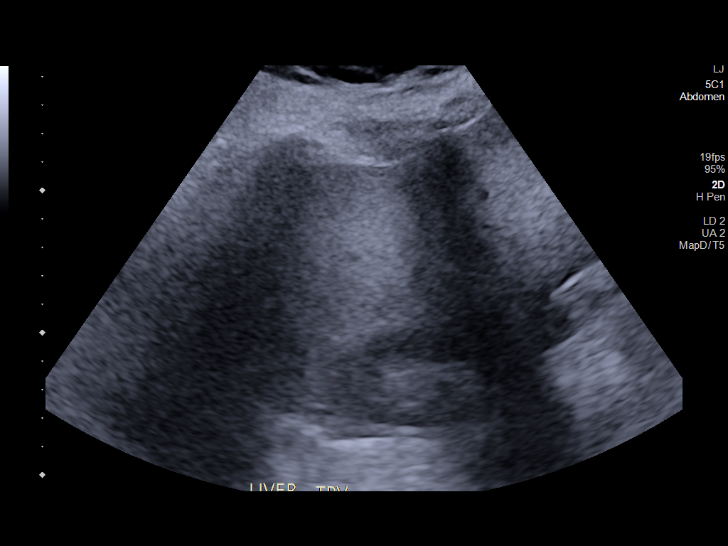
[im 27/31]
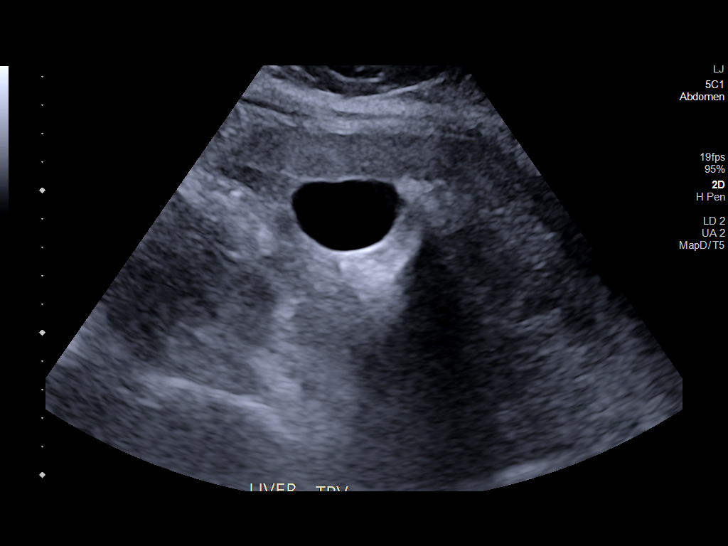
[im 29/31]
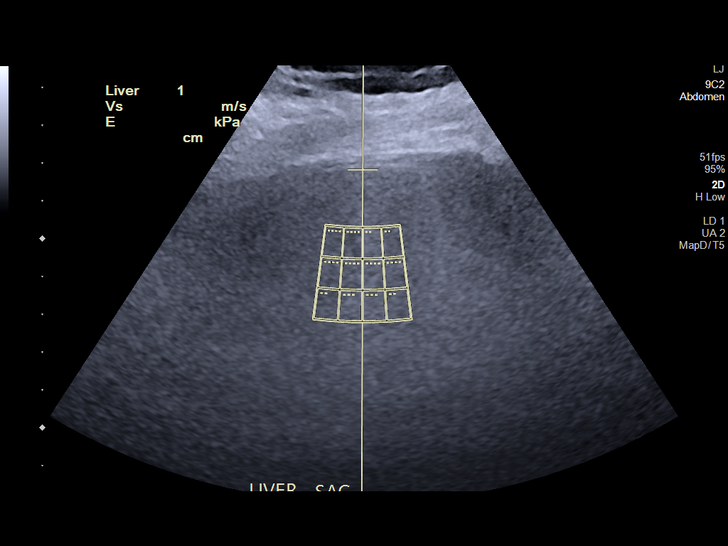

[12 of 25 positions shown; findings below may reference images not displayed]

FINDINGS: Liver: Echogenic parenchyma, can be seen with fatty infiltration,
cirrhosis and certain infiltrative disorders. No definite hepatic
mass or nodularity, with deep portions of the liver suboptimally
assessed due to sound attenuation. No intrahepatic biliary
dilatation. Portal vein is patent on color Doppler imaging with
normal direction of blood flow towards the liver.

ULTRASOUND HEPATIC ELASTOGRAPHY

Device: Siemens Helix VTQ

Patient position: Supine

Transducer: [id number]

Number of measurements: 12

Hepatic segment:  8

Median kPa:

IQR:

IQR/Median kPa ratio:

Data quality:  Good

Diagnostic category: >9 kPa and ?13 kPa: suggestive of cACLD, but
needs further testing

The use of hepatic elastography is applicable to patients with viral
hepatitis and non-alcoholic fatty liver disease. At this time, there
is insufficient data for the referenced cut-off values and use in
other causes of liver disease, including alcoholic liver disease.
Patients, however, may be assessed by elastography and serve as
their own reference standard/baseline.

In patients with non-alcoholic liver disease, the values suggesting
compensated advanced chronic liver disease (cACLD) may be lower, and
patients may need additional testing with elasticity results of [DATE]
kPa.

Please note that abnormal hepatic elasticity and shear wave
velocities may also be identified in clinical settings other than
with hepatic fibrosis, such as: acute hepatitis, elevated right
heart and central venous pressures including use of beta blockers,
Jim disease (Nayme), infiltrative processes such as
mastocytosis/amyloidosis/infiltrative tumor/lymphoma, extrahepatic
cholestasis, with hyperemia in the post-prandial state, and with
liver transplantation. Correlation with patient history, laboratory
data, and clinical condition recommended.

Diagnostic Categories:

< or =5 kPa: high probability of being normal

< or =9 kPa: in the absence of other known clinical signs, rules [DATE] kPa and ?13 kPa: suggestive of cACLD, but needs further testing

>13 kPa: highly suggestive of cACLD

> or =17 kPa: highly suggestive of cACLD with an increased
probability of clinically significant portal hypertension
IMPRESSION: ULTRASOUND LIVER:

Echogenic hepatic parenchyma which can be seen with fatty
infiltration, cirrhosis, and some infiltrative disorders.

No discrete hepatic mass.

ULTRASOUND HEPATIC ELASTOGRAPHY:

Median kPa:

Diagnostic category: >9 kPa and ?13 kPa: suggestive of cACLD, but
needs further testing

## 2023-08-21 ENCOUNTER — Ambulatory Visit
Admission: RE | Admit: 2023-08-21 | Discharge: 2023-08-21 | Disposition: A | Discharge status: Home / Self Care | Payer: HMO | Source: Ambulatory Visit | Attending: Orthopedic Surgery | Admitting: Orthopedic Surgery

## 2023-08-21 DIAGNOSIS — M545 Low back pain, unspecified: Secondary | ICD-10-CM

## 2023-10-11 ENCOUNTER — Other Ambulatory Visit: Payer: Self-pay | Admitting: Orthopedic Surgery

## 2023-10-11 DIAGNOSIS — M545 Low back pain, unspecified: Secondary | ICD-10-CM

## 2023-10-13 NOTE — Discharge Instructions (Addendum)
 Post Procedure Spinal Discharge Instruction Sheet  You may resume a regular diet and any medications that you routinely take (including pain medications) unless otherwise noted by MD.  No driving day of procedure.  Light activity throughout the rest of the day.  Do not do any strenuous work, exercise, bending or lifting.  The day following the procedure, you can resume normal physical activity but you should refrain from exercising or physical therapy for at least three days thereafter.  You may apply ice to the injection site, 20 minutes on, 20 minutes off, as needed. Do not apply ice directly to skin.    Common Side Effects:  Headaches- take your usual medications as directed by your physician.  Increase your fluid intake.  Caffeinated beverages may be helpful.  Lie flat in bed until your headache resolves.  Restlessness or inability to sleep- you may have trouble sleeping for the next few days.  Ask your referring physician if you need any medication for sleep.  Facial flushing or redness- should subside within a few days.  Increased pain- a temporary increase in pain a day or two following your procedure is not unusual.  Take your pain medication as prescribed by your referring physician.  Leg cramps  Please contact our office at [phone] for the following symptoms: Fever greater than 100 degrees. Headaches unresolved with medication after 2-3 days. Increased swelling, pain, or redness at injection site.   YOU MAY RESUME TAKING YOUR ASPIRIN TODAY.  Thank you for visiting [facility] today.

## 2023-10-17 ENCOUNTER — Other Ambulatory Visit: Payer: Self-pay | Admitting: Orthopedic Surgery

## 2023-10-17 ENCOUNTER — Ambulatory Visit
Admission: RE | Admit: 2023-10-17 | Discharge: 2023-10-17 | Disposition: A | Discharge status: Home / Self Care | Source: Ambulatory Visit | Attending: Orthopedic Surgery | Admitting: Orthopedic Surgery

## 2023-10-17 DIAGNOSIS — M545 Low back pain, unspecified: Secondary | ICD-10-CM

## 2023-10-17 MED ORDER — METHYLPREDNISOLONE ACETATE 40 MG/ML INJ SUSP (RADIOLOG
80.0000 mg | Freq: Once | INTRAMUSCULAR | Status: AC
  Administered 2023-10-17: 80 mg via EPIDURAL

## 2023-10-17 MED ORDER — IOPAMIDOL (ISOVUE-M 200) INJECTION 41%
1.0000 mL | Freq: Once | INTRAMUSCULAR | Status: AC
Start: 2023-10-17 — End: 2023-10-17
  Administered 2023-10-17: 1 mL via EPIDURAL

## 2023-12-29 NOTE — Progress Notes (Unsigned)
 [facility]  [address] [place],  [place]  [address] [phone]  Clinic Day:  12/30/2023  Referring physician: Marelyn Quill, MD  HISTORY OF PRESENT ILLNESS:  The patient is a 68 y.o. female with anemia secondary to previous iron  deficiency, as well as chronic renal insufficiency.  She comes in today to reassess her anemia.  Since her last visit, the patient has been doing okay.  She has occasional fatigue, but denies having any overt forms of blood loss which concern her for progressive anemia.    PHYSICAL EXAM:  Blood pressure (!) 142/72, pulse 72, temperature 97.7 F (36.5 C), temperature source Oral, resp. rate 18, height 5' 4 (1.626 m), weight 238 lb (108 kg), SpO2 99%. Wt Readings from Last 3 Encounters:  12/30/23 238 lb (108 kg)  06/30/23 244 lb (110.7 kg)  12/27/22 250 lb 4.8 oz (113.5 kg)   Body mass index is 40.85 kg/m. Performance status (ECOG): 1 - Symptomatic but completely ambulatory Physical Exam Constitutional:      Appearance: Normal appearance. She is not ill-appearing.  HENT:     Mouth/Throat:     Mouth: Mucous membranes are moist.     Pharynx: Oropharynx is clear. No oropharyngeal exudate or posterior oropharyngeal erythema.  Cardiovascular:     Rate and Rhythm: Normal rate and regular rhythm.     Heart sounds: No murmur heard.    No friction rub. No gallop.  Pulmonary:     Effort: Pulmonary effort is normal. No respiratory distress.     Breath sounds: Normal breath sounds. No wheezing, rhonchi or rales.  Abdominal:     General: Bowel sounds are normal. There is no distension.     Palpations: Abdomen is soft. There is no mass.     Tenderness: There is no abdominal tenderness.  Musculoskeletal:        General: No swelling.     Right lower leg: No edema.     Left lower leg: No edema.  Lymphadenopathy:     Cervical: No cervical adenopathy.     Upper Body:     Right upper body: No supraclavicular or axillary  adenopathy.     Left upper body: No supraclavicular or axillary adenopathy.     Lower Body: No right inguinal adenopathy. No left inguinal adenopathy.  Skin:    General: Skin is warm.     Coloration: Skin is not jaundiced.     Findings: No lesion or rash.  Neurological:     General: No focal deficit present.     Mental Status: She is alert and oriented to person, place, and time. Mental status is at baseline.  Psychiatric:        Mood and Affect: Mood normal.        Behavior: Behavior normal.        Thought Content: Thought content normal.    LABS:      Latest Ref Rng & Units 12/30/2023    9:02 AM 06/30/2023    1:10 PM 12/27/2022   12:00 AM  CBC  WBC 4.0 - 10.5 K/uL 6.9  9.3  6.4      Hemoglobin 12.0 - 15.0 g/dL 9.9  89.1  89.1      Hematocrit 36.0 - 46.0 % 32.6  34.4  34      Platelets 150 - 400 K/uL 350  288  277         This result is from an external source.  Latest Ref Rng & Units 12/30/2023    9:02 AM 06/30/2023    1:10 PM 12/27/2022    1:24 PM  CMP  Glucose 70 - 99 mg/dL 877  79  888   BUN 8 - 23 mg/dL 19  30  21    Creatinine 0.44 - 1.00 mg/dL 8.95  8.92  8.87   Sodium 135 - 145 mmol/L 141  143  140   Potassium 3.5 - 5.1 mmol/L 3.8  3.8  4.2   Chloride 98 - 111 mmol/L 103  103  104   CO2 22 - 32 mmol/L 27  27  27    Calcium 8.9 - 10.3 mg/dL 89.5  89.5  89.8   Total Protein 6.5 - 8.1 g/dL 7.0  7.0  7.1   Total Bilirubin 0.0 - 1.2 mg/dL 0.3  0.3  0.4   Alkaline Phos 38 - 126 U/L 88  112  51   AST 15 - 41 U/L 22  29  24    ALT 0 - 44 U/L 19  51  39    IRON  STUDIES PENDING  ASSESSMENT & PLAN:  A 68 y.o. female with anemia secondary to previous iron  deficiency and chronic renal insufficiency.  Her hemoglobin today is now slightly below 10.  Her iron  parameters today show    Based upon this,   I will see her back in 4 months for repeat clinical assessment.  The patient understands all the plans discussed today and is in agreement with them.  Dawnelle Warman DELENA Kerns,  MD

## 2023-12-30 ENCOUNTER — Other Ambulatory Visit: Payer: Self-pay | Admitting: Oncology

## 2023-12-30 ENCOUNTER — Inpatient Hospital Stay: Payer: HMO | Attending: Oncology | Admitting: Oncology

## 2023-12-30 ENCOUNTER — Telehealth: Payer: Self-pay

## 2023-12-30 ENCOUNTER — Other Ambulatory Visit: Payer: Self-pay

## 2023-12-30 ENCOUNTER — Inpatient Hospital Stay: Payer: HMO

## 2023-12-30 ENCOUNTER — Telehealth: Payer: Self-pay | Admitting: Oncology

## 2023-12-30 VITALS — BP 142/72 | HR 72 | Temp 97.7°F | Resp 18 | Ht 64.0 in | Wt 238.0 lb

## 2023-12-30 DIAGNOSIS — D508 Other iron deficiency anemias: Secondary | ICD-10-CM

## 2023-12-30 DIAGNOSIS — N183 Chronic kidney disease, stage 3 unspecified: Secondary | ICD-10-CM | POA: Diagnosis present

## 2023-12-30 DIAGNOSIS — Z794 Long term (current) use of insulin: Secondary | ICD-10-CM | POA: Diagnosis not present

## 2023-12-30 DIAGNOSIS — N189 Chronic kidney disease, unspecified: Secondary | ICD-10-CM | POA: Diagnosis not present

## 2023-12-30 DIAGNOSIS — E1122 Type 2 diabetes mellitus with diabetic chronic kidney disease: Secondary | ICD-10-CM | POA: Diagnosis not present

## 2023-12-30 DIAGNOSIS — D631 Anemia in chronic kidney disease: Secondary | ICD-10-CM | POA: Insufficient documentation

## 2023-12-30 LAB — CBC WITH DIFFERENTIAL (CANCER CENTER ONLY)
Abs Immature Granulocytes: 0.02 K/uL (ref 0.00–0.07)
Basophils Absolute: 0 K/uL (ref 0.0–0.1)
Basophils Relative: 1 %
Eosinophils Absolute: 0.4 K/uL (ref 0.0–0.5)
Eosinophils Relative: 6 %
HCT: 32.6 % — ABNORMAL LOW (ref 36.0–46.0)
Hemoglobin: 9.9 g/dL — ABNORMAL LOW (ref 12.0–15.0)
Immature Granulocytes: 0 %
Lymphocytes Relative: 34 %
Lymphs Abs: 2.3 K/uL (ref 0.7–4.0)
MCH: 27.5 pg (ref 26.0–34.0)
MCHC: 30.4 g/dL (ref 30.0–36.0)
MCV: 90.6 fL (ref 80.0–100.0)
Monocytes Absolute: 0.8 K/uL (ref 0.1–1.0)
Monocytes Relative: 12 %
Neutro Abs: 3.3 K/uL (ref 1.7–7.7)
Neutrophils Relative %: 47 %
Platelet Count: 350 K/uL (ref 150–400)
RBC: 3.6 MIL/uL — ABNORMAL LOW (ref 3.87–5.11)
RDW: 13.7 % (ref 11.5–15.5)
WBC Count: 6.9 K/uL (ref 4.0–10.5)
nRBC: 0 % (ref 0.0–0.2)

## 2023-12-30 LAB — CMP (CANCER CENTER ONLY)
ALT: 19 U/L (ref 0–44)
AST: 22 U/L (ref 15–41)
Albumin: 4.2 g/dL (ref 3.5–5.0)
Alkaline Phosphatase: 88 U/L (ref 38–126)
Anion gap: 11 (ref 5–15)
BUN: 19 mg/dL (ref 8–23)
CO2: 27 mmol/L (ref 22–32)
Calcium: 10.4 mg/dL — ABNORMAL HIGH (ref 8.9–10.3)
Chloride: 103 mmol/L (ref 98–111)
Creatinine: 1.04 mg/dL — ABNORMAL HIGH (ref 0.44–1.00)
GFR, Estimated: 58 mL/min — ABNORMAL LOW (ref >60)
Glucose, Bld: 122 mg/dL — ABNORMAL HIGH (ref 70–99)
Potassium: 3.8 mmol/L (ref 3.5–5.1)
Sodium: 141 mmol/L (ref 135–145)
Total Bilirubin: 0.3 mg/dL (ref 0.0–1.2)
Total Protein: 7 g/dL (ref 6.5–8.1)

## 2023-12-30 LAB — FERRITIN: Ferritin: 343 ng/mL — ABNORMAL HIGH (ref 11–307)

## 2023-12-30 LAB — IRON AND TIBC
Iron: 67 ug/dL (ref 28–170)
Saturation Ratios: 19 % (ref 10.4–31.8)
TIBC: 361 ug/dL (ref 250–450)
UIBC: 294 ug/dL

## 2023-12-30 NOTE — Telephone Encounter (Signed)
 Patient has been scheduled for follow-up visit per 12/30/23 LOS.  Pt given an appt calendar with date and time.

## 2023-12-30 NOTE — Telephone Encounter (Signed)
 Latest Reference Range & Units 12/30/23 09:02  Iron  28 - 170 ug/dL 67  UIBC ug/dL 705  TIBC 749 - 549 ug/dL 638  Saturation Ratios 10.4 - 31.8 % 19  Ferritin 11 - 307 ng/mL 343 (H)  (H): Data is abnormally high

## 2023-12-30 NOTE — Telephone Encounter (Signed)
 Called and spoke with patient about her iron  results. I advised that they are normal at this point, but with her Hgb still running at 9.9 that she had the option of getting a shot to boost her red blood cell count or just monitoring. She has opted to do the shots at this time

## 2023-12-31 ENCOUNTER — Encounter: Payer: Self-pay | Admitting: Oncology

## 2024-01-06 ENCOUNTER — Encounter: Payer: Self-pay | Admitting: Oncology

## 2024-01-06 ENCOUNTER — Telehealth: Payer: Self-pay | Admitting: Oncology

## 2024-01-06 DIAGNOSIS — D631 Anemia in chronic kidney disease: Secondary | ICD-10-CM | POA: Insufficient documentation

## 2024-01-06 NOTE — Telephone Encounter (Signed)
 Contacted pt to schedule an appt. Unable to reach via phone, voicemail was left.    From: Perley Alan SAILOR, CMA  Sent: 01/03/2024   1:52 PM EDT  To: Jacqui Overman Scheduling   Dr. Ezzard is going to be putting in orders for her to get injections to help boost her Hgb for every 3 months

## 2024-01-09 ENCOUNTER — Telehealth: Payer: Self-pay | Admitting: Oncology

## 2024-01-09 ENCOUNTER — Inpatient Hospital Stay

## 2024-01-09 ENCOUNTER — Telehealth: Payer: Self-pay | Admitting: Cardiology

## 2024-01-09 VITALS — BP 148/79 | HR 81 | Temp 98.4°F | Resp 18 | Ht 64.0 in | Wt 239.8 lb

## 2024-01-09 DIAGNOSIS — E1122 Type 2 diabetes mellitus with diabetic chronic kidney disease: Secondary | ICD-10-CM | POA: Diagnosis not present

## 2024-01-09 DIAGNOSIS — D631 Anemia in chronic kidney disease: Secondary | ICD-10-CM

## 2024-01-09 MED ORDER — EPOETIN ALFA-EPBX 20000 UNIT/ML IJ SOLN
20000.0000 [IU] | Freq: Once | INTRAMUSCULAR | Status: AC
  Administered 2024-01-09: 20000 [IU] via SUBCUTANEOUS
  Filled 2024-01-09: qty 1

## 2024-01-09 NOTE — Telephone Encounter (Signed)
-----   Message from Devere HERO Phy sent at 01/06/2024  1:00 PM EDT ----- I confirmed with Dr. Ezzard that he wants retacrit  20K every month, not every 3 months.  Can we add more appts on Monday when she comes? ----- Message ----- From: Lonzell Race Sent: 01/06/2024  10:09 AM EDT To: Devere HERO Manzanilla, RPH; Damaris Figueroa; Denise B#  I didn't, but orders just dropped to my queue. ----- Message ----- From: Clement Renshaw Sent: 01/06/2024   9:28 AM EDT To: Devere HERO Manzanilla, RPH; Race Lonzell; Denise Bull#  I do not see anything for her.. Do y'all happen to know anything about this? ----- Message ----- From: Perley Alan SAILOR, CMA Sent: 01/03/2024   1:52 PM EDT To: Jacqui Overman Scheduling  Dr. Ezzard is going to be putting in orders for her to get injections to help boost her Hgb for every 3 months

## 2024-01-09 NOTE — Telephone Encounter (Signed)
 I will update all parties involved pt has not been seen since 2023 and has appt 01/20/24 with Callie Goodrich, PAC.

## 2024-01-09 NOTE — Telephone Encounter (Signed)
 Patient has been scheduled per [facility].  Pt given an appt calendar with date and time.

## 2024-01-09 NOTE — Telephone Encounter (Signed)
   Pre-operative Risk Assessment    Patient Name: Syliva Mee  DOB: 1956/03/01 MRN: [id number]   Date of last office visit: 07/22/2021 Date of next office visit: 01/20/2024   Request for Surgical Clearance    Procedure:  Left total knee replacement   Date of Surgery:  Clearance TBD                                Surgeon:  Dr. Yvone Surgeon's Group or Practice Name:  [facility]  Phone number:  [phone] Fax number:  [phone]   Type of Clearance Requested:   - Medical  - Pharmacy:  Hold TBD by card      Type of Anesthesia:  Spinal   Additional requests/questions:    SignedBernarda JONETTA Fireman   01/09/2024, 9:02 AM

## 2024-01-09 NOTE — Telephone Encounter (Signed)
 Primary Cardiologist:Kardie Tobb, DO  Chart reviewed as part of pre-operative protocol coverage. Because of Stefanie Campbell's past medical history and time since last visit, he/she will require a follow-up visit in order to better assess preoperative cardiovascular risk.  Pre-op covering staff: - Pt has appointment on 01/20/24 with Aline Door, PA at which time clearance will be addressed. Appointment notes have been updated.  - Please contact requesting surgeon's office via preferred method (i.e, phone, fax) to inform them of need for appointment prior to surgery.  Rosaline EMERSON Bane, NP-C  01/09/2024, 10:18 AM [address], [address] [place], [place] [address] Office [phone] Fax [phone]

## 2024-01-09 NOTE — Progress Notes (Addendum)
 Cardiology Office Note:    Date:  01/20/2024   ID:  Stefanie Campbell, DOB [birth year], MRN [id number]  PCP:  Marelyn Quill, MD  Cardiologist:  Kardie Tobb, DO     Referring MD: Marelyn Quill, MD   Chief Complaint: follow-up of CHF  History of Present Illness:    Stefanie Campbell is a 68 y.o. 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 Sheena and presents today for routine follow-up.  Patient is primarily followed by Cardiology for diastolic CHF. Per review of chart in Care Everywhere, she had a cardiac catheterization in 2007 which showed normal coronaries. She also has a history of remote atrial fibrillation many years ago but has not been on anticoagulation for this. No documented evidence of atrial fibrillation in the [facility] system dating back to 2021.   She was diagnosed with bilateral PE in the fall of 2022 at [facility] and completed treatment with Eliquis. She was admitted at [facility] in 06/2021 for atypical chest pain. Myoview  showed a fixed inferoseptal wall defect consistent with prior infarct but no ischemia and EF was 40%.  She was last seen by Dr. Sheena in 07/2021 at which time she denied any chest pain or shortness of breath and was dong well on Bumex . However, she was complaining of some bilateral leg pain when walking. Echo was ordered to better assess LV function and showed LVEF of 50-55%, normal RV function, and mild MR. Lower extremity arterial dopplers were also ordered for further evaluation of leg pain and only showed mild atherosclerosis bilaterally but no  stenosis.   It looks like she had another Myoview  in 08/2022 at [facility] but I cannot see the results of this. Patient states this was normal.   Patient presents today for follow-up.  Here with husband.  She is in need of a left knee replacement.  Overall, it sounds like she is stable from a cardiac standpoint.  She has chronic dyspnea on exertion (sometimes even with mild activity such as walking around the house); however, she states that she has had this for a long time and it is stable.  No shortness of breath at rest.  No difficulty breathing at night on CPAP.  She denies any chest pain.  She notes occasional brief fluttering in her chest but no significant palpitations.  She is not very active.  She states her activity is primarily limited by her back and knee pain.  Per [facility] activity status index, only able to complete 3.6 METS.   ROS: No chest pain, orthopnea, PND, lower extremity edema, palpitations, lightheadedness, dizziness, syncope.    EKGs/Labs/Other Studies Reviewed:    The following studies were reviewed:  Lower Extremity Arterial Dopplers 07/30/2021: Summary: - Right: No stenosis, Mild aterosclerosis.  - Left: No stenosis, Mild aterosclerosis.  _______________  Echocardiogram 07/31/2021: Impressions: 1. TDS, GLS -14.1. Left ventricular ejection fraction, by estimation, is  50 to 55%. The left ventricle has low normal function. Left ventricular  endocardial border not optimally defined to evaluate regional wall motion.  Left ventricular diastolic  parameters are indeterminate.   2. Right ventricular systolic function is normal. The right ventricular  size is  normal.   3. The mitral valve is normal in structure. Mild mitral valve  regurgitation. No evidence of mitral stenosis.   4. The aortic valve is normal in structure. Aortic valve regurgitation is  not visualized. No aortic stenosis is present.   5. The inferior vena cava is normal in size with greater than 50%   respiratory variability, suggesting right atrial pressure of 3 mmHg.    EKG:  EKG ordered today.   EKG Interpretation Date/Time:  Friday January 20 2024 09:05:14 EDT Ventricular Rate:  63 PR Interval:  242 QRS Duration:  136 QT Interval:  450 QTC Calculation: 460 R Axis:   -61  Text Interpretation: Sinus rhythm with 1st degree A-V block Possible Left atrial enlargement Left axis deviation Right bundle branch block Left ventricular hypertrophy ( R in aVL , Romhilt-Estes ) Inferior infarct , age undetermined No previous ECGs available Confirmed by Zonya Gudger [phone]) on 01/20/2024 11:06:26 PM    Recent Labs: 12/30/2023: ALT 19; BUN 19; Creatinine 1.04; Hemoglobin 9.9; Platelet Count 350; Potassium 3.8; Sodium 141  Recent Lipid Panel No results found for: CHOL, TRIG, HDL, CHOLHDL, VLDL, LDLCALC, LDLDIRECT  Physical Exam:    Vital Signs: BP 126/68 (BP Location: Left Arm, Patient Position: Sitting, Cuff Size: Large)   Pulse 63   Ht 5' 3 (1.6 m)   Wt 229 lb 12.8 oz (104.2 kg)   SpO2 95%   BMI 40.71 kg/m     Wt Readings from Last 3 Encounters:  01/20/24 229 lb 12.8 oz (104.2 kg)  01/09/24 239 lb 12 oz (108.7 kg)  12/30/23 238 lb (108 kg)     General: 68 y.o. obese African-American female in no acute distress. HEENT: Normocephalic and atraumatic. Sclera clear.  Neck: Supple. No carotid bruits. No JVD. Heart: RRR. Distinct S1 and S2. No murmurs, gallops, or rubs.  Lungs: No increased work of breathing. Clear to ausculation bilaterally. No wheezes, rhonchi, or rales.  Extremities: No lower extremity edema.  Skin: Warm and dry. Neuro: No focal deficits. Psych: Normal affect. Responds appropriately.   Assessment:    1. Chronic heart failure with preserved ejection fraction (HFpEF) (HCC)   2. Remote atrial fibrillation   3. History of chest pain   4. PAD (peripheral artery disease) (HCC)   5. Essential hypertension   6. Hyperlipidemia, unspecified  hyperlipidemia type   7. Type 2 diabetes mellitus with complication, without long-term current use of insulin (HCC)   8. Stage 3 chronic kidney disease, unspecified whether stage 3a or 3b CKD (HCC)   9. Obstructive sleep apnea   10. Pre-op evaluation     Plan:    Chronic HFpEF Echo in 07/2021 showed LVEF of 50-55%, normal RV function, and mild MR. - Euvolemic on exam.  - Continue Bumex  1mg  daily.  - Can consider SGLT2 inhibitor or Spironolactone in the future if we run into volume issues but doing well on current therapy.  Remote Atrial Fibrillation Per notes in Care Everywhere, she has a history of remote atrial fibrillation many years ago but has not been on anticoagulation. No documented atrial fibrillation here in the [facility] system.  - EKG today shows sinus rhythm.  - Continue Toprol-XL 50mg  daily.  - CHA2DS2-VASc = 6 (CHF, mild vascular disease, HTN, DM, age, female). Not on anticoagulation as above. If she has any documented evidence of atrial fibrillation, recommend starting Eliquis.  History of Chest Pain Patient has a history of intermittent chest pain over the years. Remote LHC in  2007 showed normal coronaries. Myoview  in 2023 showed a fixed inferoseptal wall defect consistent with prior infarct but no ischemia. She had another Myoview  in 08/2022 at [facility] during an admission for chest pain - she states this was normal but I am unable to see the results.  - No recurrent chest pain.   PAD Patient reported bilateral leg pain when ambulating at last visit in 2023 that sounded concerning for claudications. However, lower extremity dopplers showed on mild atherosclerosis bilaterally but no stenosis.  - No claudication.  - Continue aspirin and statin. - Given she is asymptomatic with only mild atherosclerosis but no stenosis noted on last dopplers, don't think any repeat dopplers or ABIs are needed right now.   Hypertension BP well controlled.  - Continue Toprol-XL 50mg  daily.    Hyperlipidemia - Continue Pravastatin 80mg  daily and Gemfibrozil 300mg  twice daily.  - Labs followed by PCP.   Type 2 Diabetes Mellitus - On Metformin. - Management per PCP.   CKD Stage III Baseline creatinine around 1.0 to 1.3.  Obstructive Sleep Apnea - Continue CPAP.   Pre-Op Evaluation Patient is in need of a left knee replacement. She is stable from a cardiac standpoint. She denies any chest pain. She has chronic dyspnea but this is stable and unchanged from the past few years. No acute CHF symptoms, palpitations, dizziness, or syncope. Her activity is limited mainly by her back/ knee pain. She is only able to complete 3.6 METS. Revised Cardiac Risk Index = 2 (CHF and history of ischemic heart disease given Q waves on EKG and prior Myoview  suggestive of infarct) indicating a 5% risk of a major cardiac event perioperatively. She has had multiple stress tests over the last few years (basically one a year since 06/2019). Last Myoview  was in 08/2022 at [facility]. I am no able to see the results of this but patient states this was normal. EKG showed normal sinus rhythm with new RBBB and T wave inversions in anterior leads which I suspect is due to the RBBB. I will reach out to Dr. Sheena to see if she can see the results from most recent Myoview  and to see whether or not she recommends another Myoview  or Echo given new RBBB and given she is not able to complete >4.0 METS.  Disposition: Follow up in 1 year.    Signed, Phillippe Orlick E Diontay Rosencrans, PA-C  01/20/2024 11:32 PM    [facility]  ADDENDUM 02/05/2024: Discussed with Dr. Sheena who felt like patient was okay to proceed with procedure given she is stable from a cardiac standpoint with no new symptoms. Called and updated patient. No changes since office visit. No chest pain or new or worsening shortness of breath. Therefore, okay to proceed with knee surgery. I will route this recommendation to the requesting party via Epic fax  function.  Leeasia Secrist E Simon Llamas, PA-C 02/05/2024 2:01 PM

## 2024-01-09 NOTE — Patient Instructions (Signed)

## 2024-01-20 ENCOUNTER — Ambulatory Visit: Attending: Student | Admitting: Student

## 2024-01-20 ENCOUNTER — Encounter: Payer: Self-pay | Admitting: Student

## 2024-01-20 VITALS — BP 126/68 | HR 63 | Ht 63.0 in | Wt 229.8 lb

## 2024-01-20 DIAGNOSIS — E785 Hyperlipidemia, unspecified: Secondary | ICD-10-CM

## 2024-01-20 DIAGNOSIS — Z87898 Personal history of other specified conditions: Secondary | ICD-10-CM | POA: Diagnosis not present

## 2024-01-20 DIAGNOSIS — Z01818 Encounter for other preprocedural examination: Secondary | ICD-10-CM | POA: Diagnosis not present

## 2024-01-20 DIAGNOSIS — I10 Essential (primary) hypertension: Secondary | ICD-10-CM

## 2024-01-20 DIAGNOSIS — I5032 Chronic diastolic (congestive) heart failure: Secondary | ICD-10-CM | POA: Diagnosis not present

## 2024-01-20 DIAGNOSIS — G4733 Obstructive sleep apnea (adult) (pediatric): Secondary | ICD-10-CM

## 2024-01-20 DIAGNOSIS — I739 Peripheral vascular disease, unspecified: Secondary | ICD-10-CM

## 2024-01-20 DIAGNOSIS — E118 Type 2 diabetes mellitus with unspecified complications: Secondary | ICD-10-CM

## 2024-01-20 DIAGNOSIS — I480 Paroxysmal atrial fibrillation: Secondary | ICD-10-CM

## 2024-01-20 DIAGNOSIS — N183 Chronic kidney disease, stage 3 unspecified: Secondary | ICD-10-CM

## 2024-01-20 NOTE — Patient Instructions (Signed)
 Medication Instructions:  No medication changes were made during today's visit.  *If you need a refill on your cardiac medications before your next appointment, please call your pharmacy*   Lab Work: No labs were ordered during today's visit.  If you have labs (blood work) drawn today and your tests are completely normal, you will receive your results only by: MyChart Message (if you have MyChart) OR A paper copy in the mail If you have any lab test that is abnormal or we need to change your treatment, we will call you to review the results.   Testing/Procedures: No procedures were ordered during today's visit.    Follow-Up: At [facility], you and your health needs are our priority.  As part of our continuing mission to provide you with exceptional heart care, we have created designated Provider Care Teams.  These Care Teams include your primary Cardiologist (physician) and Advanced Practice Providers (APPs -  Physician Assistants and Nurse Practitioners) who all work together to provide you with the care you need, when you need it.  We recommend signing up for the patient portal called MyChart.  Sign up information is provided on this After Visit Summary.  MyChart is used to connect with patients for Virtual Visits (Telemedicine).  Patients are able to view lab/test results, encounter notes, upcoming appointments, etc.  Non-urgent messages can be sent to your provider as well.   To learn more about what you can do with MyChart, go to [email or web address].    Your next appointment:   1 year(s)  Provider:   Kardie Tobb, DO    Other Instructions Thank you for choosing [facility]! A letter will be mailed to you as a reminder to call the office for your next follow up appointment.

## 2024-02-06 ENCOUNTER — Telehealth: Payer: Self-pay

## 2024-02-06 ENCOUNTER — Other Ambulatory Visit

## 2024-02-06 ENCOUNTER — Ambulatory Visit

## 2024-02-06 NOTE — Telephone Encounter (Signed)
 Patient cleared 01/20/2024 by Aline Door, PA.  Will fax over note to requesting office.

## 2024-02-06 NOTE — Telephone Encounter (Signed)
   Pre-operative Risk Assessment    Patient Name: Stefanie Campbell  DOB: 24-Nov-1955 MRN: [id number]   Date of last office visit: 01/20/24 ALINE DOOR, PA-C Date of next office visit: NONE   Request for Surgical Clearance    Procedure:  LEFT TOTAL KNEE ARTHROPLASTY  Date of Surgery:  Clearance TBD                                Surgeon:  NORLEEN JAMA GAVEL, MD Surgeon's Group or Practice Name:  [facility] Phone number:  [phone] Fax number:  [phone]  ATTN: JUDY DANIELS   Type of Clearance Requested:   - Medical  - Pharmacy:  Hold Aspirin     Type of Anesthesia:  Spinal   Additional requests/questions:    SignedLucie DELENA Ku   02/06/2024, 10:02 AM

## 2024-02-07 ENCOUNTER — Inpatient Hospital Stay

## 2024-02-07 ENCOUNTER — Inpatient Hospital Stay: Attending: Oncology

## 2024-02-07 VITALS — BP 131/65 | HR 71 | Temp 98.3°F | Resp 18

## 2024-02-07 DIAGNOSIS — D631 Anemia in chronic kidney disease: Secondary | ICD-10-CM | POA: Diagnosis not present

## 2024-02-07 DIAGNOSIS — N1831 Chronic kidney disease, stage 3a: Secondary | ICD-10-CM | POA: Diagnosis present

## 2024-02-07 LAB — CBC WITH DIFFERENTIAL (CANCER CENTER ONLY)
Abs Immature Granulocytes: 0.01 K/uL (ref 0.00–0.07)
Basophils Absolute: 0.1 K/uL (ref 0.0–0.1)
Basophils Relative: 1 %
Eosinophils Absolute: 0.3 K/uL (ref 0.0–0.5)
Eosinophils Relative: 5 %
HCT: 32.4 % — ABNORMAL LOW (ref 36.0–46.0)
Hemoglobin: 9.7 g/dL — ABNORMAL LOW (ref 12.0–15.0)
Immature Granulocytes: 0 %
Lymphocytes Relative: 32 %
Lymphs Abs: 1.9 K/uL (ref 0.7–4.0)
MCH: 26.6 pg (ref 26.0–34.0)
MCHC: 29.9 g/dL — ABNORMAL LOW (ref 30.0–36.0)
MCV: 89 fL (ref 80.0–100.0)
Monocytes Absolute: 0.6 K/uL (ref 0.1–1.0)
Monocytes Relative: 10 %
Neutro Abs: 3.1 K/uL (ref 1.7–7.7)
Neutrophils Relative %: 52 %
Platelet Count: 324 K/uL (ref 150–400)
RBC: 3.64 MIL/uL — ABNORMAL LOW (ref 3.87–5.11)
RDW: 14 % (ref 11.5–15.5)
WBC Count: 5.9 K/uL (ref 4.0–10.5)
nRBC: 0 % (ref 0.0–0.2)

## 2024-02-07 MED ORDER — EPOETIN ALFA-EPBX 20000 UNIT/ML IJ SOLN
20000.0000 [IU] | Freq: Once | INTRAMUSCULAR | Status: AC
  Administered 2024-02-07: 20000 [IU] via SUBCUTANEOUS
  Filled 2024-02-07: qty 1

## 2024-02-07 NOTE — Patient Instructions (Signed)

## 2024-03-05 ENCOUNTER — Inpatient Hospital Stay

## 2024-03-05 ENCOUNTER — Inpatient Hospital Stay: Attending: Oncology

## 2024-03-05 VITALS — BP 145/84 | HR 71 | Temp 98.2°F | Resp 18

## 2024-03-05 DIAGNOSIS — N189 Chronic kidney disease, unspecified: Secondary | ICD-10-CM | POA: Insufficient documentation

## 2024-03-05 DIAGNOSIS — D631 Anemia in chronic kidney disease: Secondary | ICD-10-CM

## 2024-03-05 LAB — CBC WITH DIFFERENTIAL (CANCER CENTER ONLY)
Abs Immature Granulocytes: 0.01 K/uL (ref 0.00–0.07)
Basophils Absolute: 0 K/uL (ref 0.0–0.1)
Basophils Relative: 1 %
Eosinophils Absolute: 0.3 K/uL (ref 0.0–0.5)
Eosinophils Relative: 5 %
HCT: 31 % — ABNORMAL LOW (ref 36.0–46.0)
Hemoglobin: 9.3 g/dL — ABNORMAL LOW (ref 12.0–15.0)
Immature Granulocytes: 0 %
Lymphocytes Relative: 33 %
Lymphs Abs: 1.7 K/uL (ref 0.7–4.0)
MCH: 26.6 pg (ref 26.0–34.0)
MCHC: 30 g/dL (ref 30.0–36.0)
MCV: 88.6 fL (ref 80.0–100.0)
Monocytes Absolute: 0.7 K/uL (ref 0.1–1.0)
Monocytes Relative: 13 %
Neutro Abs: 2.5 K/uL (ref 1.7–7.7)
Neutrophils Relative %: 48 %
Platelet Count: 305 K/uL (ref 150–400)
RBC: 3.5 MIL/uL — ABNORMAL LOW (ref 3.87–5.11)
RDW: 14.1 % (ref 11.5–15.5)
WBC Count: 5.1 K/uL (ref 4.0–10.5)
nRBC: 0 % (ref 0.0–0.2)

## 2024-03-05 MED ORDER — EPOETIN ALFA-EPBX 20000 UNIT/ML IJ SOLN
20000.0000 [IU] | Freq: Once | INTRAMUSCULAR | Status: AC
  Administered 2024-03-05: 20000 [IU] via SUBCUTANEOUS
  Filled 2024-03-05: qty 1

## 2024-03-05 NOTE — Patient Instructions (Signed)

## 2024-03-22 ENCOUNTER — Other Ambulatory Visit: Payer: Self-pay | Admitting: Orthopedic Surgery

## 2024-03-22 NOTE — Progress Notes (Signed)
Sent message, via epic in basket, requesting order in epic from surgeon  

## 2024-03-26 NOTE — Progress Notes (Signed)
Second request for pre op orders spoke with: Bethena Roys

## 2024-03-27 NOTE — Patient Instructions (Signed)
 SURGICAL WAITING ROOM VISITATION  Patients having surgery or a procedure may have no more than 2 support people in the waiting area - these visitors may rotate.    Children under the age of 70 must have an adult with them who is not the patient.  Visitors with respiratory illnesses are discouraged from visiting and should remain at home.  If the patient needs to stay at the hospital during part of their recovery, the visitor guidelines for inpatient rooms apply. Pre-op nurse will coordinate an appropriate time for 1 support person to accompany patient in pre-op.  This support person may not rotate.    Please refer to the [facility] website for the visitor guidelines for Inpatients (after your surgery is over and you are in a regular room).    Your procedure is scheduled on: 04/02/24   Report to [facility] Main Entrance    Report to admitting at 5:15 AM   Call this number if you have problems the morning of surgery [phone]   Do not eat food :After Midnight.   After Midnight you may have the following liquids until 4:30 AM DAY OF SURGERY  Water Non-Citrus Juices (without pulp, NO RED-Apple, White grape, White cranberry) Black Coffee (NO MILK/CREAM OR CREAMERS, sugar ok)  Clear Tea (NO MILK/CREAM OR CREAMERS, sugar ok) regular and decaf                             Plain Jell-O (NO RED)                                           Fruit ices (not with fruit pulp, NO RED)                                     Popsicles (NO RED)                                                               Sports drinks like Gatorade (NO RED)                 The day of surgery:  Drink ONE (1) Pre-Surgery G2 at 4:30 AM the morning of surgery. Drink in one sitting. Do not sip.  This drink was given to you during your hospital  pre-op appointment visit. Nothing else to drink after completing the  Pre-Surgery G2.          If you have questions, please contact your surgeon's  office.   FOLLOW BOWEL PREP AND ANY ADDITIONAL PRE OP INSTRUCTIONS YOU RECEIVED FROM YOUR SURGEON'S OFFICE!!!     Oral Hygiene is also important to reduce your risk of infection.                                    Remember - BRUSH YOUR TEETH THE MORNING OF SURGERY WITH YOUR REGULAR TOOTHPASTE  DENTURES WILL BE REMOVED PRIOR TO SURGERY PLEASE DO NOT APPLY Poly grip OR ADHESIVES!!!  Stop all vitamins and herbal supplements 7 days before surgery.   Take these medicines the morning of surgery with A SIP OF WATER: Tylenol, Inhalers, Amlodipine , Zyrtec, Duloxetine, Pepcid, Metoprolol, Pantoprazole, Prednisone   DO NOT TAKE ANY ORAL DIABETIC MEDICATIONS DAY OF YOUR SURGERY  Bring CPAP mask and tubing day of surgery.                              You may not have any metal on your body including hair pins, jewelry, and body piercing             Do not wear make-up, lotions, powders, perfumes, or deodorant  Do not wear nail polish including gel and S&S, artificial/acrylic nails, or any other type of covering on natural nails including finger and toenails. If you have artificial nails, gel coating, etc. that needs to be removed by a nail salon please have this removed prior to surgery or surgery may need to be canceled/ delayed if the surgeon/ anesthesia feels like they are unable to be safely monitored.   Do not shave  48 hours prior to surgery.    Do not bring valuables to the hospital. [facility] IS NOT             RESPONSIBLE   FOR VALUABLES.   Contacts, glasses, dentures or bridgework may not be worn into surgery.   Bring small overnight bag day of surgery.   DO NOT BRING YOUR HOME MEDICATIONS TO THE HOSPITAL. PHARMACY WILL DISPENSE MEDICATIONS LISTED ON YOUR MEDICATION LIST TO YOU DURING YOUR ADMISSION IN THE HOSPITAL!    Patients discharged on the day of surgery will not be allowed to drive home.  Someone NEEDS to stay with you for the first 24 hours after anesthesia               Please read over the following fact sheets you were given: IF YOU HAVE QUESTIONS ABOUT YOUR PRE-OP INSTRUCTIONS PLEASE CALL [phone].   If you received a COVID test during your pre-op visit  it is requested that you wear a mask when out in public, stay away from anyone that may not be feeling well and notify your surgeon if you develop symptoms. If you test positive for Covid or have been in contact with anyone that has tested positive in the last 10 days please notify you surgeon.      Pre-operative 4 CHG Bath Instructions  DYNA-Hex 4 Chlorhexidine Gluconate 4% Solution Antiseptic 4 fl. oz   You can play a key role in reducing the risk of infection after surgery. Your skin needs to be as free of germs as possible. You can reduce the number of germs on your skin by washing with CHG (chlorhexidine gluconate) soap before surgery. CHG is an antiseptic soap that kills germs and continues to kill germs even after washing.   DO NOT use if you have an allergy to chlorhexidine/CHG or antibacterial soaps. If your skin becomes reddened or irritated, stop using the CHG and notify one of our RNs at   Please shower with the CHG soap starting 4 days before surgery using the following schedule:     Please keep in mind the following:  DO NOT shave, including legs and underarms, starting the day of your first shower.   You may shave your face at any point before/day of surgery.  Place clean sheets on your bed the day you  start using CHG soap. Use a clean washcloth (not used since being washed) for each shower. DO NOT sleep with pets once you start using the CHG.  CHG Shower Instructions:  If you choose to wash your hair and private area, wash first with your normal shampoo/soap.  After you use shampoo/soap, rinse your hair and body thoroughly to remove shampoo/soap residue.  Turn the water OFF and apply about 3 tablespoons (45 ml) of CHG soap to a CLEAN washcloth.  Apply CHG soap ONLY FROM  YOUR NECK DOWN TO YOUR TOES (washing for 3-5 minutes)  DO NOT use CHG soap on face, private areas, open wounds, or sores.  Pay special attention to the area where your surgery is being performed.  If you are having back surgery, having someone wash your back for you may be helpful. Wait 2 minutes after CHG soap is applied, then you may rinse off the CHG soap.  Pat dry with a clean towel  Put on clean clothes/pajamas   If you choose to wear lotion, please use ONLY the CHG-compatible lotions on the back of this paper.     Additional instructions for the day of surgery: DO NOT APPLY any lotions, deodorants, cologne, or perfumes.   Put on clean/comfortable clothes.  Brush your teeth.  Ask your nurse before applying any prescription medications to the skin.   CHG Compatible Lotions   Aveeno Moisturizing lotion  Cetaphil Moisturizing Cream  Cetaphil Moisturizing Lotion  Clairol Herbal Essence Moisturizing Lotion, Dry Skin  Clairol Herbal Essence Moisturizing Lotion, Extra Dry Skin  Clairol Herbal Essence Moisturizing Lotion, Normal Skin  Curel Age Defying Therapeutic Moisturizing Lotion with Alpha Hydroxy  Curel Extreme Care Body Lotion  Curel Soothing Hands Moisturizing Hand Lotion  Curel Therapeutic Moisturizing Cream, Fragrance-Free  Curel Therapeutic Moisturizing Lotion, Fragrance-Free  Curel Therapeutic Moisturizing Lotion, Original Formula  Eucerin Daily Replenishing Lotion  Eucerin Dry Skin Therapy Plus Alpha Hydroxy Crme  Eucerin Dry Skin Therapy Plus Alpha Hydroxy Lotion  Eucerin Original Crme  Eucerin Original Lotion  Eucerin Plus Crme Eucerin Plus Lotion  Eucerin TriLipid Replenishing Lotion  Keri Anti-Bacterial Hand Lotion  Keri Deep Conditioning Original Lotion Dry Skin Formula Softly Scented  Keri Deep Conditioning Original Lotion, Fragrance Free Sensitive Skin Formula  Keri Lotion Fast Absorbing Fragrance Free Sensitive Skin Formula  Keri Lotion Fast Absorbing  Softly Scented Dry Skin Formula  Keri Original Lotion  Keri Skin Renewal Lotion Keri Silky Smooth Lotion  Keri Silky Smooth Sensitive Skin Lotion  Nivea Body Creamy Conditioning Oil  Nivea Body Extra Enriched Lotion  Nivea Body Original Lotion  Nivea Body Sheer Moisturizing Lotion Nivea Crme  Nivea Skin Firming Lotion  NutraDerm 30 Skin Lotion  NutraDerm Skin Lotion  NutraDerm Therapeutic Skin Cream  NutraDerm Therapeutic Skin Lotion  ProShield Protective Hand Cream  Provon moisturizing lotion  View Pre-Surgery Education Videos:  [email or web address]      Incentive Spirometer  An incentive spirometer is a tool that can help keep your lungs clear and active. This tool measures how well you are filling your lungs with each breath. Taking long deep breaths may help reverse or decrease the chance of developing breathing (pulmonary) problems (especially infection) following: A long period of time when you are unable to move or be active. BEFORE THE PROCEDURE  If the spirometer includes an indicator to show your best effort, your nurse or respiratory therapist will set it to a desired goal. If possible, sit up straight or lean slightly forward. Try  not to slouch. Hold the incentive spirometer in an upright position. INSTRUCTIONS FOR USE  Sit on the edge of your bed if possible, or sit up as far as you can in bed or on a chair. Hold the incentive spirometer in an upright position. Breathe out normally. Place the mouthpiece in your mouth and seal your lips tightly around it. Breathe in slowly and as deeply as possible, raising the piston or the ball toward the top of the column. Hold your breath for 3-5 seconds or for as long as possible. Allow the piston or ball to fall to the bottom of the column. Remove the mouthpiece from your mouth and breathe out normally. Rest for a few seconds and repeat Steps 1 through 7 at least 10  times every 1-2 hours when you are awake. Take your time and take a few normal breaths between deep breaths. The spirometer may include an indicator to show your best effort. Use the indicator as a goal to work toward during each repetition. After each set of 10 deep breaths, practice coughing to be sure your lungs are clear. If you have an incision (the cut made at the time of surgery), support your incision when coughing by placing a pillow or rolled up towels firmly against it. Once you are able to get out of bed, walk around indoors and cough well. You may stop using the incentive spirometer when instructed by your caregiver.  RISKS AND COMPLICATIONS Take your time so you do not get dizzy or light-headed. If you are in pain, you may need to take or ask for pain medication before doing incentive spirometry. It is harder to take a deep breath if you are having pain. AFTER USE Rest and breathe slowly and easily. It can be helpful to keep track of a log of your progress. Your caregiver can provide you with a simple table to help with this. If you are using the spirometer at home, follow these instructions: SEEK MEDICAL CARE IF:  You are having difficultly using the spirometer. You have trouble using the spirometer as often as instructed. Your pain medication is not giving enough relief while using the spirometer. You develop fever of 100.5 F (38.1 C) or higher. SEEK IMMEDIATE MEDICAL CARE IF:  You cough up bloody sputum that had not been present before. You develop fever of 102 F (38.9 C) or greater. You develop worsening pain at or near the incision site. MAKE SURE YOU:  Understand these instructions. Will watch your condition. Will get help right away if you are not doing well or get worse. Document Released: 10/18/2006 Document Revised: 08/30/2011 Document Reviewed: 12/19/2006 [facility] Patient Information 2014 [place],  [place].   ________________________________________________________________________

## 2024-03-27 NOTE — Progress Notes (Signed)
 COVID Vaccine Completed: yes  Date of COVID positive in last 90 days:  PCP - Laurita Ano, MD Cardiologist - Dub Huntsman, DO  Medical clearance in media tab dated 02/28/24  Chest x-ray - N/A EKG - 01/20/24 Epic Stress Test - 09/01/22 Epic ECHO - 07/31/21 Epic Cardiac Cath -  Pacemaker/ICD device last checked:N/A Spinal Cord Stimulator:N/A  Bowel Prep - N/A  Sleep Study - N/A CPAP -   Fasting Blood Sugar - N/A Checks Blood Sugar _____ times a day  Last dose of GLP1 agonist-  N/A GLP1 instructions:  Do not take after     Last dose of SGLT-2 inhibitors-  N/A SGLT-2 instructions:  Do not take after     Blood Thinner Instructions: N/A Last dose:   Time: Aspirin Instructions: ASA 81 Last Dose:  Activity level:  Can go up a flight of stairs and perform activities of daily living without stopping and without symptoms of chest pain or shortness of breath.  Able to exercise without symptoms  Unable to go up a flight of stairs without symptoms of     Anesthesia review: HTN, PAF- not on anticoagulation, CHF, OSA, CP, palpitations, PE, stroke, needs cardiac clearance   Patient denies shortness of breath, fever, cough and chest pain at PAT appointment  Patient verbalized understanding of instructions that were given to them at the PAT appointment. Patient was also instructed that they will need to review over the PAT instructions again at home before surgery.

## 2024-03-27 NOTE — Progress Notes (Signed)
 Please place orders for PAT appointment scheduled 03/28/24.

## 2024-03-28 ENCOUNTER — Ambulatory Visit (HOSPITAL_COMMUNITY)
Admission: RE | Admit: 2024-03-28 | Discharge: 2024-03-28 | Disposition: A | Discharge status: Home / Self Care | Source: Ambulatory Visit | Attending: Orthopedic Surgery | Admitting: Orthopedic Surgery

## 2024-03-28 ENCOUNTER — Other Ambulatory Visit: Payer: Self-pay

## 2024-03-28 ENCOUNTER — Encounter (HOSPITAL_COMMUNITY): Payer: Self-pay

## 2024-03-28 ENCOUNTER — Encounter (HOSPITAL_COMMUNITY)
Admission: RE | Admit: 2024-03-28 | Discharge: 2024-03-28 | Disposition: A | Discharge status: Home / Self Care | Source: Ambulatory Visit | Attending: Orthopedic Surgery | Admitting: Orthopedic Surgery

## 2024-03-28 VITALS — BP 149/83 | HR 69 | Temp 98.2°F | Resp 18 | Ht 63.0 in | Wt 213.0 lb

## 2024-03-28 DIAGNOSIS — Z01818 Encounter for other preprocedural examination: Secondary | ICD-10-CM

## 2024-03-28 HISTORY — DX: Personal history of urinary calculi: Z87.442

## 2024-03-28 HISTORY — DX: Pneumonia, unspecified organism: J18.9

## 2024-03-28 HISTORY — DX: Fatty (change of) liver, not elsewhere classified: K76.0

## 2024-03-28 HISTORY — DX: Prediabetes: R73.03

## 2024-03-28 LAB — SURGICAL PCR SCREEN
MRSA, PCR: NEGATIVE
Staphylococcus aureus: NEGATIVE

## 2024-03-29 NOTE — Progress Notes (Signed)
 Anesthesia Chart Review   Case: [id number] Date/Time: 04/02/24 0715   Procedure: ARTHROPLASTY, KNEE, TOTAL (Left: Knee)   Anesthesia type: Spinal   Pre-op diagnosis: LEFT KNEE DEGENERATIVE JOINT DISEASE   Location: [facility] ROOM 07 / [facility]   Surgeons: Yvone Rush, MD       DISCUSSION:68 y.o. never smoker with h/o sleep apnea w/cpap, HTN, remote atrial fibrillation, PE 2022, giant cell arteritis on prednisone, left knee djd scheduled for above procedure 04/02/2024 with Dr. Rush Yvone.   Pt last seen by cardiology 01/20/24. Per OV note, Patient is in need of a left knee replacement. She is stable from a cardiac standpoint. She denies any chest pain. She has chronic dyspnea but this is stable and unchanged from the past few years. No acute CHF symptoms, palpitations, dizziness, or syncope. Her activity is limited mainly by her back/ knee pain. She is only able to complete 3.6 METS. Revised Cardiac Risk Index = 2 (CHF and history of ischemic heart disease given Q waves on EKG and prior Myoview  suggestive of infarct) indicating a 5% risk of a major cardiac event perioperatively. She has had multiple stress tests over the last few years (basically one a year since 06/2019). Last Myoview  was in 08/2022 at [facility]. I am no able to see the results of this but patient states this was normal. EKG showed normal sinus rhythm with new RBBB and T wave inversions in anterior leads which I suspect is due to the RBBB. I will reach out to Dr. Sheena to see if she can see the results from most recent Myoview  and to see whether or not she recommends another Myoview  or Echo given new RBBB and given she is not able to complete >4.0 METS.  ADDENDUM 02/05/2024: Discussed with Dr. Sheena who felt like patient was okay to proceed with procedure given she is stable from a cardiac standpoint with no new symptoms. Called and updated patient. No changes since office visit. No chest pain or new or worsening shortness of breath. Therefore,  okay to proceed with knee surgery. I will route this recommendation to the requesting party via Epic fax function.  Hemoglobin 9.3, forwarded to Dr. Yvone. Rechecked on 03/30/24, now 9.9.   VS: BP (!) 149/83   Pulse 69   Temp 36.8 C (Oral)   Resp 18   Ht 5' 3 (1.6 m)   Wt 96.6 kg   SpO2 97%   BMI 37.73 kg/m   PROVIDERS: Sistasis, Rowena, MD is PCP    Cardiologist - Dub Sheena, DO   LABS: Labs reviewed: Acceptable for surgery. (all labs ordered are listed, but only abnormal results are displayed)  Labs Reviewed  SURGICAL PCR SCREEN     IMAGES:   EKG:   CV: Echo 07/31/2021 1. TDS, GLS -14.1. Left ventricular ejection fraction, by estimation, is  50 to 55%. The left ventricle has low normal function. Left ventricular  endocardial border not optimally defined to evaluate regional wall motion.  Left ventricular diastolic  parameters are indeterminate.   2. Right ventricular systolic function is normal. The right ventricular  size is normal.   3. The mitral valve is normal in structure. Mild mitral valve  regurgitation. No evidence of mitral stenosis.   4. The aortic valve is normal in structure. Aortic valve regurgitation is  not visualized. No aortic stenosis is present.   5. The inferior vena cava is normal in size with greater than 50%  respiratory variability, suggesting right atrial pressure of  3 mmHg.  Past Medical History:  Diagnosis Date   Aftercare following surgery 11/15/2017   Allergy    Anemia    Arthritis    Bilateral leg edema 11/15/2019   Blood transfusion without reported diagnosis    allergic and had to stop 15 minutes in    Chest pain 10/06/2019   Chronic pain of right ankle 07/12/2017   Chronic venous insufficiency 07/12/2017   Class 3 severe obesity in adult [facility]) 11/24/2018   Current use of steroid medication 01/26/2019   Depression    Essential hypertension 10/06/2019   Family history of colon cancer    Fatty liver    GERD  (gastroesophageal reflux disease)    Giant cell arteritis (HCC) 11/24/2018   Graves disease    History of deep venous thrombosis    History of degenerative joint disease    History of kidney stones    Hyperlipidemia    Hypertension    Liver disease    Low back pain 03/12/2013   Low back strain 02/16/2016   Mixed hyperlipidemia 10/06/2019   Morbid obesity (HCC) 02/26/2020   Osteoarthritis of hip 06/05/2018   Other headache syndrome 01/26/2019   PAF (paroxysmal atrial fibrillation) (HCC) 04/13/2015   Formatting of this note might be different from the original. CHADS2 vasc score= 2 Formatting of this note might be different from the original. Overview:  CHADS2 vasc score= 2   Pain in unspecified knee 03/12/2013   Palpitations 10/06/2019   Pneumonia    Pre-diabetes    Primary osteoarthritis of one hip, right 06/05/2018   Pulmonary embolism (HCC)    Sleep apnea    wears cpap    Stroke (HCC)    mini stroke per pt    Syncope 04/13/2015   Tendonitis of ankle, right 07/12/2017    Past Surgical History:  Procedure Laterality Date   ABDOMINAL HYSTERECTOMY     BSO   BREAST SURGERY     benign cyst   CARPAL TUNNEL RELEASE Right    CESAREAN SECTION     x2   COLONOSCOPY  08/09/2014   Mild pan colonic diverticulosis. Small internal hemorrhoids. Otherwise grossly normal colonoscopy to the terninal ileum. The colon is highly redundant   FOOT SURGERY     on toe    KNEE ARTHROSCOPY Left    temperol biopsy  2019   TOTAL HIP ARTHROPLASTY Right     MEDICATIONS:  acetaminophen (TYLENOL) 500 MG tablet   albuterol (VENTOLIN HFA) 108 (90 Base) MCG/ACT inhaler   alendronate (FOSAMAX) 70 MG tablet   amLODipine  (NORVASC ) 5 MG tablet   aspirin EC 81 MG tablet   bumetanide  (BUMEX ) 1 MG tablet   calcium carbonate (OS-CAL) 600 MG TABS tablet   cetirizine (ZYRTEC) 10 MG tablet   colchicine 0.6 MG tablet   DULoxetine (CYMBALTA) 30 MG capsule   famotidine (PEPCID) 20 MG tablet   gemfibrozil  (LOPID) 600 MG tablet   losartan (COZAAR) 25 MG tablet   meloxicam (MOBIC) 15 MG tablet   metFORMIN (GLUCOPHAGE) 500 MG tablet   methocarbamol (ROBAXIN) 500 MG tablet   metoprolol succinate (TOPROL-XL) 50 MG 24 hr tablet   pantoprazole (PROTONIX) 20 MG tablet   potassium chloride  (KLOR-CON  M) 10 MEQ tablet   pravastatin (PRAVACHOL) 80 MG tablet   predniSONE (DELTASONE) 5 MG tablet   SYMBICORT 160-4.5 MCG/ACT inhaler   VITAMIN D PO    0.9 %  sodium chloride  infusion     Craige Patel Ward, PA-C [employer]  Pre-Surgical Testing [phone]

## 2024-03-30 ENCOUNTER — Other Ambulatory Visit: Payer: Self-pay | Admitting: Pharmacist

## 2024-03-30 ENCOUNTER — Inpatient Hospital Stay

## 2024-03-30 ENCOUNTER — Inpatient Hospital Stay: Attending: Oncology

## 2024-03-30 VITALS — BP 132/71 | HR 74 | Temp 98.5°F

## 2024-03-30 DIAGNOSIS — Z23 Encounter for immunization: Secondary | ICD-10-CM | POA: Diagnosis not present

## 2024-03-30 DIAGNOSIS — N1831 Chronic kidney disease, stage 3a: Secondary | ICD-10-CM | POA: Insufficient documentation

## 2024-03-30 DIAGNOSIS — D631 Anemia in chronic kidney disease: Secondary | ICD-10-CM | POA: Diagnosis not present

## 2024-03-30 LAB — CBC WITH DIFFERENTIAL (CANCER CENTER ONLY)
Abs Immature Granulocytes: 0.02 K/uL (ref 0.00–0.07)
Basophils Absolute: 0.1 K/uL (ref 0.0–0.1)
Basophils Relative: 1 %
Eosinophils Absolute: 0.4 K/uL (ref 0.0–0.5)
Eosinophils Relative: 6 %
HCT: 33 % — ABNORMAL LOW (ref 36.0–46.0)
Hemoglobin: 9.9 g/dL — ABNORMAL LOW (ref 12.0–15.0)
Immature Granulocytes: 0 %
Lymphocytes Relative: 27 %
Lymphs Abs: 1.8 K/uL (ref 0.7–4.0)
MCH: 26.1 pg (ref 26.0–34.0)
MCHC: 30 g/dL (ref 30.0–36.0)
MCV: 87.1 fL (ref 80.0–100.0)
Monocytes Absolute: 0.7 K/uL (ref 0.1–1.0)
Monocytes Relative: 11 %
Neutro Abs: 3.7 K/uL (ref 1.7–7.7)
Neutrophils Relative %: 55 %
Platelet Count: 391 K/uL (ref 150–400)
RBC: 3.79 MIL/uL — ABNORMAL LOW (ref 3.87–5.11)
RDW: 14.9 % (ref 11.5–15.5)
WBC Count: 6.7 K/uL (ref 4.0–10.5)
nRBC: 0 % (ref 0.0–0.2)

## 2024-03-30 MED ORDER — INFLUENZA VAC SPLIT HIGH-DOSE 0.5 ML IM SUSY
0.5000 mL | PREFILLED_SYRINGE | Freq: Once | INTRAMUSCULAR | Status: AC
  Administered 2024-03-30: 0.5 mL via INTRAMUSCULAR
  Filled 2024-03-30: qty 0.5

## 2024-03-30 MED ORDER — EPOETIN ALFA-EPBX 20000 UNIT/ML IJ SOLN
20000.0000 [IU] | Freq: Once | INTRAMUSCULAR | Status: AC
  Administered 2024-03-30: 20000 [IU] via SUBCUTANEOUS
  Filled 2024-03-30: qty 1

## 2024-03-30 NOTE — Patient Instructions (Addendum)
 Influenza Vaccine Injection What is this medication? INFLUENZA VACCINE (in floo EN zuh vak SEEN) reduces the risk of the influenza (flu). It does not treat influenza. It is still possible to get influenza after receiving this vaccine, but the symptoms may be less severe or not last as long. It works by helping your immune system learn how to fight off a future infection. This medicine may be used for other purposes; ask your health care provider or pharmacist if you have questions. COMMON BRAND NAME(S): Afluria Trivalent, FLUAD Trivalent, Fluarix Trivalent, Flublok Trivalent, FLUCELVAX Trivalent, Flulaval Trivalent, Fluzone Trivalent What should I tell my care team before I take this medication? They need to know if you have any of these conditions: Bleeding disorder like hemophilia Fever or infection Guillain-Barre syndrome or other neurological problems Immune system problems Infection with the human immunodeficiency virus (HIV) or AIDS Low blood platelet counts Multiple sclerosis An unusual or allergic reaction to influenza virus vaccine, latex, other medications, foods, dyes, or preservatives. Different brands of vaccines contain different allergens. Some may contain latex or eggs. Talk to your care team about your allergies to make sure that you get the right vaccine. Pregnant or trying to get pregnant Breastfeeding How should I use this medication? This vaccine is injected into a muscle or under the skin. It is given by your care team. A copy of Vaccine Information Statements will be given before each vaccination. Be sure to read this sheet carefully each time. This sheet may change often. Talk to your care team to see which vaccines are right for you. Some vaccines should not be used in all age groups. Overdosage: If you think you have taken too much of this medicine contact a poison control center or emergency room at once. NOTE: This medicine is only for you. Do not share this medicine  with others. What if I miss a dose? This does not apply. What may interact with this medication? Certain medications that lower your immune system, such as etanercept, anakinra, infliximab, adalimumab Certain medications that prevent or treat blood clots, such as warfarin Chemotherapy or radiation therapy Phenytoin Steroid medications, such as prednisone or cortisone Theophylline Vaccines This list may not describe all possible interactions. Give your health care provider a list of all the medicines, herbs, non-prescription drugs, or dietary supplements you use. Also tell them if you smoke, drink alcohol, or use illegal drugs. Some items may interact with your medicine. What should I watch for while using this medication? Report any side effects that do not go away with your care team. Call your care team if any unusual symptoms occur within 6 weeks of receiving this vaccine. You may still catch the flu, but the illness is not usually as bad. You cannot get the flu from the vaccine. The vaccine will not protect against colds or other illnesses that may cause fever. The vaccine is needed every year. What side effects may I notice from receiving this medication? Side effects that you should report to your care team as soon as possible: Allergic reactions--skin rash, itching, hives, swelling of the face, lips, tongue, or throat Side effects that usually do not require medical attention (report these to your care team if they continue or are bothersome): Chills Fatigue Headache Joint pain Loss of appetite Muscle pain Nausea Pain, redness, or irritation at injection site This list may not describe all possible side effects. Call your doctor for medical advice about side effects. You may report side effects to FDA at  1-800-FDA-1088. Where should I keep my medication? The vaccine is only given by your care team. It will not be stored at home. NOTE: This sheet is a summary. It may not cover all  possible information. If you have questions about this medicine, talk to your doctor, pharmacist, or health care provider.  2024 Elsevier/Gold Standard (2021-11-17 00:00:00)Epoetin  Alfa Injection What is this medication? EPOETIN  ALFA (e POE e tin AL fa) treats low levels of red blood cells (anemia) caused by kidney disease, chemotherapy, or HIV medications. It can also be used in people who are at risk for blood loss during surgery. It works by Systems analyst make more red blood cells, which reduces the need for blood transfusions. This medicine may be used for other purposes; ask your health care provider or pharmacist if you have questions. COMMON BRAND NAME(S): Epogen , Procrit, Retacrit  What should I tell my care team before I take this medication? They need to know if you have any of these conditions: Blood clots Cancer Heart disease High blood pressure On dialysis Seizures Stroke An unusual or allergic reaction to epoetin  alfa, albumin, benzyl alcohol, other medications, foods, dyes, or preservatives Pregnant or trying to get pregnant Breast-feeding How should I use this medication? This medication is injected into a vein or under the skin. It is usually given by your care team in a hospital or clinic setting. It may also be given at home. If you get this medication at home, you will be taught how to prepare and give it. Use exactly as directed. Take it as directed on the prescription label at the same time every day. Keep taking it unless your care team tells you to stop. It is important that you put your used needles and syringes in a special sharps container. Do not put them in a trash can. If you do not have a sharps container, call your pharmacist or care team to get one. A special MedGuide will be given to you by the pharmacist with each prescription and refill. Be sure to read this information carefully each time. Talk to your care team about the use of this medication in  children. While this medication may be used in children as young as 1 month of age for selected conditions, precautions do apply. Overdosage: If you think you have taken too much of this medicine contact a poison control center or emergency room at once. NOTE: This medicine is only for you. Do not share this medicine with others. What if I miss a dose? If you miss a dose, take it as soon as you can. If it is almost time for your next dose, take only that dose. Do not take double or extra doses. What may interact with this medication? Darbepoetin alfa Methoxy polyethylene glycol-epoetin  beta This list may not describe all possible interactions. Give your health care provider a list of all the medicines, herbs, non-prescription drugs, or dietary supplements you use. Also tell them if you smoke, drink alcohol, or use illegal drugs. Some items may interact with your medicine. What should I watch for while using this medication? Visit your care team for regular checks on your progress. Check your blood pressure as directed. Know what your blood pressure should be and when to contact your care team. Your condition will be monitored carefully while you are receiving this medication. You may need blood work while taking this medication. What side effects may I notice from receiving this medication? Side effects that you should report  to your care team as soon as possible: Allergic reactions--skin rash, itching, hives, swelling of the face, lips, tongue, or throat Blood clot--pain, swelling, or warmth in the leg, shortness of breath, chest pain Heart attack--pain or tightness in the chest, shoulders, arms, or jaw, nausea, shortness of breath, cold or clammy skin, feeling faint or lightheaded Increase in blood pressure Rash, fever, and swollen lymph nodes Redness, blistering, peeling, or loosening of the skin, including inside the mouth Seizures Stroke--sudden numbness or weakness of the face, arm, or  leg, trouble speaking, confusion, trouble walking, loss of balance or coordination, dizziness, severe headache, change in vision Side effects that usually do not require medical attention (report to your care team if they continue or are bothersome): Bone, joint, or muscle pain Cough Headache Nausea Pain, redness, or irritation at injection site This list may not describe all possible side effects. Call your doctor for medical advice about side effects. You may report side effects to FDA at 1-800-FDA-1088. Where should I keep my medication? Keep out of the reach of children and pets. Store in a refrigerator. Do not freeze. Do not shake. Protect from light. Keep this medication in the original container until you are ready to take it. See product for storage information. Get rid of any unused medication after the expiration date. To get rid of medications that are no longer needed or have expired: Take the medication to a medication take-back program. Check with your pharmacy or law enforcement to find a location. If you cannot return the medication, ask your pharmacist or care team how to get rid of the medication safely. NOTE: This sheet is a summary. It may not cover all possible information. If you have questions about this medicine, talk to your doctor, pharmacist, or health care provider.  2024 Elsevier/Gold Standard (2021-10-09 00:00:00)

## 2024-03-30 NOTE — Anesthesia Preprocedure Evaluation (Addendum)
 Anesthesia Evaluation  Patient identified by MRN, date of birth, ID band Patient awake    Reviewed: Allergy & Precautions, NPO status , Patient's Chart, lab work & pertinent test results, reviewed documented beta blocker date and time   History of Anesthesia Complications Negative for: history of anesthetic complications  Airway Mallampati: II  TM Distance: >3 FB Neck ROM: Full    Dental  (+) Edentulous Upper, Teeth Intact, Dental Advisory Given   Pulmonary sleep apnea and Continuous Positive Airway Pressure Ventilation , neg recent URI, PE   breath sounds clear to auscultation       Cardiovascular hypertension, (-) angina (-) Cardiac Stents and (-) PND + dysrhythmias (RBBB) Atrial Fibrillation  Rhythm:Regular Rate:Normal  Echo 07/31/2021 1. TDS, GLS -14.1. Left ventricular ejection fraction, by estimation, is  50 to 55%. The left ventricle has low normal function. Left ventricular  endocardial border not optimally defined to evaluate regional wall motion.  Left ventricular diastolic  parameters are indeterminate.   2. Right ventricular systolic function is normal. The right ventricular  size is normal.   3. The mitral valve is normal in structure. Mild mitral valve  regurgitation. No evidence of mitral stenosis.   4. The aortic valve is normal in structure. Aortic valve regurgitation is  not visualized. No aortic stenosis is present.   5. The inferior vena cava is normal in size with greater than 50%  respiratory variability, suggesting right atrial pressure of 3 mmHg.     Neuro/Psych neg Seizures    GI/Hepatic ,GERD  ,,  Endo/Other    Renal/GU Renal disease     Musculoskeletal  (+) Arthritis ,    Abdominal   Peds  Hematology  (+) Blood dyscrasia, anemia Hgb 9.9   Anesthesia Other Findings   Reproductive/Obstetrics                              Anesthesia Physical Anesthesia  Plan  ASA: 3  Anesthesia Plan: Spinal and Regional   Post-op Pain Management:    Induction: Intravenous  PONV Risk Score and Plan: 1 and Propofol infusion and Treatment may vary due to age or medical condition  Airway Management Planned: Simple Face Mask  Additional Equipment:   Intra-op Plan:   Post-operative Plan: Extubation in OR  Informed Consent:      Dental advisory given  Plan Discussed with: CRNA and Surgeon  Anesthesia Plan Comments: (See PAT note 03/28/2024)         Anesthesia Quick Evaluation

## 2024-04-01 NOTE — H&P (Signed)
 TOTAL KNEE ADMISSION H&P  Patient is being admitted for left total knee arthroplasty.  Subjective:  Chief Complaint:left knee pain.  HPI: Stefanie Campbell, 68 y.o. female, has a history of pain and functional disability in the left knee due to arthritis and has failed non-surgical conservative treatments for greater than 12 weeks to includeNSAID's and/or analgesics, corticosteriod injections, viscosupplementation injections, weight reduction as appropriate, and activity modification.  Onset of symptoms was gradual, starting 5 years ago with gradually worsening course since that time. The patient noted no past surgery on the left knee(s).  Patient currently rates pain in the left knee(s) at 9 out of 10 with activity. Patient has night pain, worsening of pain with activity and weight bearing, pain that interferes with activities of daily living, pain with passive range of motion, and joint swelling.  Patient has evidence of subchondral cysts, subchondral sclerosis, periarticular osteophytes, joint subluxation, and joint space narrowing by imaging studies. This patient has had failure of all reasonable care. There is no active infection.  Patient Active Problem List   Diagnosis Date Noted   Anemia in chronic kidney disease 01/06/2024   Iron  deficiency anemia 03/27/2021   Elevated ferritin 03/09/2021   Morbid obesity (HCC) 02/26/2020   Chronic diastolic heart failure (HCC) 01/25/2020   Allergy    Anemia    Arthritis    Blood transfusion without reported diagnosis    GERD (gastroesophageal reflux disease)    Jacobi Nile disease    Hyperlipidemia    Hypertension    Liver disease    Sleep apnea    Stroke [facility])    Bilateral leg edema 11/15/2019   Chest pain 10/06/2019   Essential hypertension 10/06/2019   Palpitations 10/06/2019   Mixed hyperlipidemia 10/06/2019   Other headache syndrome 01/26/2019   Current use of steroid medication 01/26/2019   Class 3 severe obesity in adult [facility])  11/24/2018   Giant cell arteritis (HCC) 11/24/2018   Osteoarthritis of hip 06/05/2018   Primary osteoarthritis of one hip, right 06/05/2018   Aftercare following surgery 11/15/2017   Chronic pain of right ankle 07/12/2017   Chronic venous insufficiency 07/12/2017   Tendonitis of ankle, right 07/12/2017   Low back strain 02/16/2016   PAF (paroxysmal atrial fibrillation) (HCC) 04/13/2015   Syncope 04/13/2015   Low back pain 03/12/2013   Pain in unspecified knee 03/12/2013   Past Medical History:  Diagnosis Date   Aftercare following surgery 11/15/2017   Allergy    Anemia    Arthritis    Bilateral leg edema 11/15/2019   Blood transfusion without reported diagnosis    allergic and had to stop 15 minutes in    Chest pain 10/06/2019   Chronic pain of right ankle 07/12/2017   Chronic venous insufficiency 07/12/2017   Class 3 severe obesity in adult [facility]) 11/24/2018   Current use of steroid medication 01/26/2019   Depression    Essential hypertension 10/06/2019   Family history of colon cancer    Fatty liver    GERD (gastroesophageal reflux disease)    Giant cell arteritis (HCC) 11/24/2018   Dmetrius Ambs disease    History of deep venous thrombosis    History of degenerative joint disease    History of kidney stones    Hyperlipidemia    Hypertension    Liver disease    Low back pain 03/12/2013   Low back strain 02/16/2016   Mixed hyperlipidemia 10/06/2019   Morbid obesity (HCC) 02/26/2020   Osteoarthritis of hip 06/05/2018  Other headache syndrome 01/26/2019   PAF (paroxysmal atrial fibrillation) (HCC) 04/13/2015   Formatting of this note might be different from the original. CHADS2 vasc score= 2 Formatting of this note might be different from the original. Overview:  CHADS2 vasc score= 2   Pain in unspecified knee 03/12/2013   Palpitations 10/06/2019   Pneumonia    Pre-diabetes    Primary osteoarthritis of one hip, right 06/05/2018   Pulmonary embolism (HCC)    Sleep apnea     wears cpap    Stroke [facility])    mini stroke per pt    Syncope 04/13/2015   Tendonitis of ankle, right 07/12/2017    Past Surgical History:  Procedure Laterality Date   ABDOMINAL HYSTERECTOMY     BSO   BREAST SURGERY     benign cyst   CARPAL TUNNEL RELEASE Right    CESAREAN SECTION     x2   COLONOSCOPY  08/09/2014   Mild pan colonic diverticulosis. Small internal hemorrhoids. Otherwise grossly normal colonoscopy to the terninal ileum. The colon is highly redundant   FOOT SURGERY     on toe    KNEE ARTHROSCOPY Left    temperol biopsy  2019   TOTAL HIP ARTHROPLASTY Right     Current Facility-Administered Medications  Medication Dose Route Frequency Provider Last Rate Last Admin   0.9 %  sodium chloride  infusion  500 mL Intravenous Once Charlanne Groom, MD       Current Outpatient Medications  Medication Sig Dispense Refill Last Dose/Taking   acetaminophen (TYLENOL) 500 MG tablet Take 500 mg by mouth daily as needed for moderate pain (pain score 4-6).   Taking As Needed   albuterol (VENTOLIN HFA) 108 (90 Base) MCG/ACT inhaler albuterol sulfate HFA 90 mcg/actuation aerosol inhaler  TAKE 2 PUFFS BY MOUTH EVERY 4 TO 6 HOURS AS NEEDED   Taking   alendronate (FOSAMAX) 70 MG tablet Take 70 mg by mouth every Saturday.   Taking   amLODipine  (NORVASC ) 5 MG tablet Take 5 mg by mouth in the morning.   Taking   aspirin EC 81 MG tablet Take 81 mg by mouth in the morning. Swallow whole.   Taking   bumetanide  (BUMEX ) 1 MG tablet Take 1 mg by mouth in the morning.   Taking   calcium carbonate (OS-CAL) 600 MG TABS tablet Take 1,200 mg by mouth daily.   Taking   colchicine 0.6 MG tablet Take 0.6 mg by mouth in the morning.   Taking   DULoxetine (CYMBALTA) 30 MG capsule Take 30 mg by mouth in the morning.   Taking   famotidine (PEPCID) 20 MG tablet Take 20 mg by mouth 2 (two) times daily.   Taking   gemfibrozil (LOPID) 600 MG tablet Take 300 mg by mouth in the morning and at bedtime.   Taking    losartan (COZAAR) 25 MG tablet Take 25 mg by mouth in the morning.   Taking   meloxicam (MOBIC) 15 MG tablet Take 15 mg by mouth daily as needed for pain.   Taking As Needed   metFORMIN (GLUCOPHAGE) 500 MG tablet Take 500 mg by mouth 2 (two) times daily with a meal.   Taking   methocarbamol (ROBAXIN) 500 MG tablet Take 500-1,000 mg by mouth 4 (four) times daily as needed for muscle spasms.   Taking As Needed   metoprolol succinate (TOPROL-XL) 50 MG 24 hr tablet Take 50 mg by mouth in the morning.   Taking   pantoprazole (  PROTONIX) 20 MG tablet Take 20 mg by mouth daily.   Taking   potassium chloride  (KLOR-CON  M) 10 MEQ tablet Take 5 mEq by mouth daily.   Taking   pravastatin (PRAVACHOL) 80 MG tablet Take 80 mg by mouth at bedtime.   Taking   predniSONE (DELTASONE) 5 MG tablet Take 5 mg by mouth every other day.   Taking   VITAMIN D PO Take 1,000 Units by mouth daily.   Taking   cetirizine (ZYRTEC) 10 MG tablet Take 10 mg by mouth as needed.       SYMBICORT 160-4.5 MCG/ACT inhaler Inhale 2 puffs into the lungs 2 (two) times daily.  (Patient not taking: Reported on 01/20/2024)      Allergies  Allergen Reactions   Hydrocodone Shortness Of Breath    Pt states she has taken percocet with no issues and believes she has taken dilaudid but is not positive    Magnesium-Containing Compounds Anaphylaxis    Has been in hospital with last 2 colon preps     Social History   Tobacco Use   Smoking status: Never   Smokeless tobacco: Never  Substance Use Topics   Alcohol use: Not Currently    Family History  Problem Relation Age of Onset   Colon cancer Father        in his late 44's   Colon polyps Sister    Heart attack Sister    Breast cancer Sister    Heart attack Sister    Esophageal cancer Neg Hx    Rectal cancer Neg Hx    Stomach cancer Neg Hx      Review of Systems ROS: I have reviewed the patient's review of systems thoroughly and there are no positive responses as relates to the HPI.   Objective:  Physical Exam  Vital signs in last 24 hours:  Well-developed well-nourished patient in no acute distress. Alert and oriented x3 HEENT:within normal limits Cardiac: Regular rate and rhythm Pulmonary: Lungs clear to auscultation Abdomen: Soft and nontender.  Normal active bowel sounds  Musculoskeletal: (l knee: painful rom limited rom no instability. No erythema or warmthr)   Labs: Recent Results (from the past 2160 hours)  CBC with Differential (Cancer Center Only)     Status: Abnormal   Collection Time: 02/07/24  9:37 AM  Result Value Ref Range   WBC Count 5.9 4.0 - 10.5 K/uL   RBC 3.64 (L) 3.87 - 5.11 MIL/uL   Hemoglobin 9.7 (L) 12.0 - 15.0 g/dL   HCT 67.5 (L) 63.9 - 53.9 %   MCV 89.0 80.0 - 100.0 fL   MCH 26.6 26.0 - 34.0 pg   MCHC 29.9 (L) 30.0 - 36.0 g/dL   RDW 85.9 88.4 - 84.4 %   Platelet Count 324 150 - 400 K/uL   nRBC 0.0 0.0 - 0.2 %   Neutrophils Relative % 52 %   Neutro Abs 3.1 1.7 - 7.7 K/uL   Lymphocytes Relative 32 %   Lymphs Abs 1.9 0.7 - 4.0 K/uL   Monocytes Relative 10 %   Monocytes Absolute 0.6 0.1 - 1.0 K/uL   Eosinophils Relative 5 %   Eosinophils Absolute 0.3 0.0 - 0.5 K/uL   Basophils Relative 1 %   Basophils Absolute 0.1 0.0 - 0.1 K/uL   Immature Granulocytes 0 %   Abs Immature Granulocytes 0.01 0.00 - 0.07 K/uL    Comment: Performed at [facility] at [facility], [address] [place], [place], [address]  CBC with Differential (Cancer Center Only)     Status: Abnormal   Collection Time: 03/05/24  9:40 AM  Result Value Ref Range   WBC Count 5.1 4.0 - 10.5 K/uL   RBC 3.50 (L) 3.87 - 5.11 MIL/uL   Hemoglobin 9.3 (L) 12.0 - 15.0 g/dL   HCT 68.9 (L) 63.9 - 53.9 %   MCV 88.6 80.0 - 100.0 fL   MCH 26.6 26.0 - 34.0 pg   MCHC 30.0 30.0 - 36.0 g/dL   RDW 85.8 88.4 - 84.4 %   Platelet Count 305 150 - 400 K/uL   nRBC 0.0 0.0 - 0.2 %   Neutrophils Relative % 48 %   Neutro Abs 2.5 1.7 - 7.7 K/uL   Lymphocytes Relative 33 %    Lymphs Abs 1.7 0.7 - 4.0 K/uL   Monocytes Relative 13 %   Monocytes Absolute 0.7 0.1 - 1.0 K/uL   Eosinophils Relative 5 %   Eosinophils Absolute 0.3 0.0 - 0.5 K/uL   Basophils Relative 1 %   Basophils Absolute 0.0 0.0 - 0.1 K/uL   Immature Granulocytes 0 %   Abs Immature Granulocytes 0.01 0.00 - 0.07 K/uL    Comment: Performed at [facility] at [facility], [address], [place], [place], [address]  Surgical pcr screen     Status: None   Collection Time: 03/28/24 12:06 PM   Specimen: Nasal Mucosa; Nasal Swab  Result Value Ref Range   MRSA, PCR NEGATIVE NEGATIVE   Staphylococcus aureus NEGATIVE NEGATIVE    Comment: (NOTE) The Xpert SA Assay (FDA approved for NASAL specimens in patients 90 years of age and older), is one component of a comprehensive surveillance program. It is not intended to diagnose infection nor to guide or monitor treatment. Performed at [facility], [address] [place], [place] [address]   CBC with Differential (Cancer Center Only)     Status: Abnormal   Collection Time: 03/30/24  9:36 AM  Result Value Ref Range   WBC Count 6.7 4.0 - 10.5 K/uL   RBC 3.79 (L) 3.87 - 5.11 MIL/uL   Hemoglobin 9.9 (L) 12.0 - 15.0 g/dL   HCT 66.9 (L) 63.9 - 53.9 %   MCV 87.1 80.0 - 100.0 fL   MCH 26.1 26.0 - 34.0 pg   MCHC 30.0 30.0 - 36.0 g/dL   RDW 85.0 88.4 - 84.4 %   Platelet Count 391 150 - 400 K/uL   nRBC 0.0 0.0 - 0.2 %   Neutrophils Relative % 55 %   Neutro Abs 3.7 1.7 - 7.7 K/uL   Lymphocytes Relative 27 %   Lymphs Abs 1.8 0.7 - 4.0 K/uL   Monocytes Relative 11 %   Monocytes Absolute 0.7 0.1 - 1.0 K/uL   Eosinophils Relative 6 %   Eosinophils Absolute 0.4 0.0 - 0.5 K/uL   Basophils Relative 1 %   Basophils Absolute 0.1 0.0 - 0.1 K/uL   Immature Granulocytes 0 %   Abs Immature Granulocytes 0.02 0.00 - 0.07 K/uL    Comment: Performed at [facility] at [facility], [address] [place], [place], [address]      Estimated body mass index is 37.73 kg/m as calculated from the following:   Height as of 03/28/24: 5' 3 (1.6 m).   Weight as of 03/28/24: 96.6 kg.   Imaging Review Plain radiographs demonstrate severe degenerative joint disease of the left knee(s). The overall alignment ismild varus. The bone quality  appears to be fair for age and reported activity level.      Assessment/Plan:  End stage arthritis, left knee   The patient history, physical examination, clinical judgment of the provider and imaging studies are consistent with end stage degenerative joint disease of the left knee(s) and total knee arthroplasty is deemed medically necessary. The treatment options including medical management, injection therapy arthroscopy and arthroplasty were discussed at length. The risks and benefits of total knee arthroplasty were presented and reviewed. The risks due to aseptic loosening, infection, stiffness, patella tracking problems, thromboembolic complications and other imponderables were discussed. The patient acknowledged the explanation, agreed to proceed with the plan and consent was signed. Patient is being admitted for inpatient treatment for surgery, pain control, PT, OT, prophylactic antibiotics, VTE prophylaxis, progressive ambulation and ADL's and discharge planning. The patient is planning to be discharged home with home health services     Patient's anticipated LOS is less than 2 midnights, meeting these requirements: - Younger than 41 - Lives within 1 hour of care - Has a competent adult at home to recover with post-op recover - NO history of  - Chronic pain requiring opiods  - Diabetes  - Coronary Artery Disease  - Heart failure  - Heart attack  - Stroke  - DVT/VTE  - Cardiac arrhythmia  - Respiratory Failure/COPD  - Renal failure  - Anemia  - Advanced Liver disease

## 2024-04-02 ENCOUNTER — Ambulatory Visit (HOSPITAL_COMMUNITY): Payer: Self-pay | Admitting: Medical

## 2024-04-02 ENCOUNTER — Ambulatory Visit (HOSPITAL_COMMUNITY)
Admission: RE | Admit: 2024-04-02 | Discharge: 2024-04-04 | Disposition: A | Discharge status: Home / Self Care | Attending: Orthopedic Surgery | Admitting: Orthopedic Surgery

## 2024-04-02 ENCOUNTER — Other Ambulatory Visit: Payer: Self-pay

## 2024-04-02 ENCOUNTER — Encounter (HOSPITAL_COMMUNITY)
Admission: RE | Disposition: A | Discharge status: Home / Self Care | Payer: Self-pay | Source: Home / Self Care | Attending: Orthopedic Surgery

## 2024-04-02 ENCOUNTER — Other Ambulatory Visit

## 2024-04-02 ENCOUNTER — Ambulatory Visit (HOSPITAL_BASED_OUTPATIENT_CLINIC_OR_DEPARTMENT_OTHER)

## 2024-04-02 ENCOUNTER — Encounter (HOSPITAL_COMMUNITY): Payer: Self-pay | Admitting: Orthopedic Surgery

## 2024-04-02 ENCOUNTER — Ambulatory Visit

## 2024-04-02 DIAGNOSIS — N189 Chronic kidney disease, unspecified: Secondary | ICD-10-CM | POA: Insufficient documentation

## 2024-04-02 DIAGNOSIS — E785 Hyperlipidemia, unspecified: Secondary | ICD-10-CM

## 2024-04-02 DIAGNOSIS — Z96652 Presence of left artificial knee joint: Secondary | ICD-10-CM

## 2024-04-02 DIAGNOSIS — M1712 Unilateral primary osteoarthritis, left knee: Secondary | ICD-10-CM

## 2024-04-02 DIAGNOSIS — E1122 Type 2 diabetes mellitus with diabetic chronic kidney disease: Secondary | ICD-10-CM | POA: Insufficient documentation

## 2024-04-02 DIAGNOSIS — I5032 Chronic diastolic (congestive) heart failure: Secondary | ICD-10-CM | POA: Diagnosis not present

## 2024-04-02 DIAGNOSIS — I130 Hypertensive heart and chronic kidney disease with heart failure and stage 1 through stage 4 chronic kidney disease, or unspecified chronic kidney disease: Secondary | ICD-10-CM | POA: Diagnosis not present

## 2024-04-02 DIAGNOSIS — D631 Anemia in chronic kidney disease: Secondary | ICD-10-CM | POA: Diagnosis not present

## 2024-04-02 DIAGNOSIS — G4730 Sleep apnea, unspecified: Secondary | ICD-10-CM | POA: Diagnosis not present

## 2024-04-02 DIAGNOSIS — I10 Essential (primary) hypertension: Secondary | ICD-10-CM

## 2024-04-02 DIAGNOSIS — I480 Paroxysmal atrial fibrillation: Secondary | ICD-10-CM

## 2024-04-02 HISTORY — PX: TOTAL KNEE ARTHROPLASTY: SHX125

## 2024-04-02 LAB — GLUCOSE, CAPILLARY
Glucose-Capillary: 112 mg/dL — ABNORMAL HIGH (ref 70–99)
Glucose-Capillary: 109 mg/dL — ABNORMAL HIGH (ref 70–99)
Glucose-Capillary: 228 mg/dL — ABNORMAL HIGH (ref 70–99)
Glucose-Capillary: 173 mg/dL — ABNORMAL HIGH (ref 70–99)

## 2024-04-02 LAB — HEMOGLOBIN A1C
Hgb A1c MFr Bld: 5.2 % (ref 4.8–5.6)
Mean Plasma Glucose: 102.54 mg/dL

## 2024-04-02 SURGERY — ARTHROPLASTY, KNEE, TOTAL
Anesthesia: Spinal | Site: Knee | Laterality: Left

## 2024-04-02 MED ORDER — OXYCODONE HCL 5 MG PO TABS
5.0000 mg | ORAL_TABLET | ORAL | Status: DC | PRN
  Administered 2024-04-03: 10 mg via ORAL
  Filled 2024-04-02 (×3): qty 2

## 2024-04-02 MED ORDER — TRANEXAMIC ACID-NACL 1000-0.7 MG/100ML-% IV SOLN
1000.0000 mg | Freq: Once | INTRAVENOUS | Status: AC
  Administered 2024-04-02: 1000 mg via INTRAVENOUS
  Filled 2024-04-02: qty 100

## 2024-04-02 MED ORDER — ONDANSETRON HCL 4 MG PO TABS: 4.0000 mg | ORAL_TABLET | Freq: Four times a day (QID) | ORAL | Status: DC | PRN

## 2024-04-02 MED ORDER — COLCHICINE 0.6 MG PO TABS
0.6000 mg | ORAL_TABLET | Freq: Every morning | ORAL | Status: DC
  Administered 2024-04-03 – 2024-04-04 (×2): 0.6 mg via ORAL
  Filled 2024-04-02 (×3): qty 1

## 2024-04-02 MED ORDER — DROPERIDOL 2.5 MG/ML IJ SOLN: 0.6250 mg | Freq: Once | INTRAMUSCULAR | Status: DC | PRN

## 2024-04-02 MED ORDER — PHENYLEPHRINE HCL-NACL 20-0.9 MG/250ML-% IV SOLN
INTRAVENOUS | Status: DC | PRN
Start: 2024-04-02 — End: 2024-04-02
  Administered 2024-04-02: 35 ug/min via INTRAVENOUS

## 2024-04-02 MED ORDER — LACTATED RINGERS IV SOLN: INTRAVENOUS | Status: DC

## 2024-04-02 MED ORDER — AMLODIPINE BESYLATE 5 MG PO TABS
5.0000 mg | ORAL_TABLET | Freq: Every morning | ORAL | Status: DC
  Administered 2024-04-03 – 2024-04-04 (×2): 5 mg via ORAL
  Filled 2024-04-02 (×3): qty 1

## 2024-04-02 MED ORDER — PHENYLEPHRINE HCL (PRESSORS) 10 MG/ML IV SOLN
INTRAVENOUS | Status: AC
  Filled 2024-04-02: qty 1

## 2024-04-02 MED ORDER — FLEET ENEMA RE ENEM: 1.0000 | ENEMA | Freq: Once | RECTAL | Status: DC | PRN

## 2024-04-02 MED ORDER — PHENOL 1.4 % MT LIQD: 1.0000 | OROMUCOSAL | Status: DC | PRN

## 2024-04-02 MED ORDER — DIPHENHYDRAMINE HCL 12.5 MG/5ML PO ELIX: 12.5000 mg | ORAL_SOLUTION | ORAL | Status: DC | PRN

## 2024-04-02 MED ORDER — BUMETANIDE 1 MG PO TABS
1.0000 mg | ORAL_TABLET | Freq: Every morning | ORAL | Status: DC
  Administered 2024-04-03 – 2024-04-04 (×2): 1 mg via ORAL
  Filled 2024-04-02 (×2): qty 1

## 2024-04-02 MED ORDER — EPHEDRINE 5 MG/ML INJ
INTRAVENOUS | Status: AC
  Filled 2024-04-02: qty 5

## 2024-04-02 MED ORDER — GEMFIBROZIL 600 MG PO TABS
300.0000 mg | ORAL_TABLET | Freq: Two times a day (BID) | ORAL | Status: DC
  Administered 2024-04-02 – 2024-04-04 (×4): 300 mg via ORAL
  Filled 2024-04-02 (×5): qty 0.5

## 2024-04-02 MED ORDER — ONDANSETRON HCL 4 MG/2ML IJ SOLN: 4.0000 mg | Freq: Once | INTRAMUSCULAR | Status: DC | PRN

## 2024-04-02 MED ORDER — ROPIVACAINE HCL 5 MG/ML IJ SOLN
INTRAMUSCULAR | Status: DC | PRN
Start: 2024-04-02 — End: 2024-04-02
  Administered 2024-04-02: 20 mL via PERINEURAL

## 2024-04-02 MED ORDER — PREDNISONE 5 MG PO TABS
5.0000 mg | ORAL_TABLET | ORAL | Status: DC
  Administered 2024-04-04: 5 mg via ORAL
  Filled 2024-04-02: qty 1

## 2024-04-02 MED ORDER — ONDANSETRON HCL 4 MG/2ML IJ SOLN
INTRAMUSCULAR | Status: AC
Start: 2024-04-02 — End: 2024-04-02
  Filled 2024-04-02: qty 2

## 2024-04-02 MED ORDER — CHLORHEXIDINE GLUCONATE 0.12 % MT SOLN
15.0000 mL | Freq: Once | OROMUCOSAL | Status: AC
  Administered 2024-04-02: 15 mL via OROMUCOSAL

## 2024-04-02 MED ORDER — LOSARTAN POTASSIUM 25 MG PO TABS
25.0000 mg | ORAL_TABLET | Freq: Every morning | ORAL | Status: DC
  Administered 2024-04-03 – 2024-04-04 (×2): 25 mg via ORAL
  Filled 2024-04-02 (×3): qty 1

## 2024-04-02 MED ORDER — POVIDONE-IODINE 10 % EX SWAB: 2.0000 | Freq: Once | CUTANEOUS | Status: AC

## 2024-04-02 MED ORDER — METHOCARBAMOL 500 MG PO TABS
500.0000 mg | ORAL_TABLET | Freq: Four times a day (QID) | ORAL | Status: DC | PRN
  Administered 2024-04-02 – 2024-04-04 (×5): 500 mg via ORAL
  Filled 2024-04-02 (×5): qty 1

## 2024-04-02 MED ORDER — 0.9 % SODIUM CHLORIDE (POUR BTL) OPTIME
TOPICAL | Status: DC | PRN
  Administered 2024-04-02: 1000 mL

## 2024-04-02 MED ORDER — BUPIVACAINE LIPOSOME 1.3 % IJ SUSP: 20.0000 mL | Freq: Once | INTRAMUSCULAR | Status: AC

## 2024-04-02 MED ORDER — ORAL CARE MOUTH RINSE: 15.0000 mL | OROMUCOSAL | Status: DC | PRN

## 2024-04-02 MED ORDER — BUPIVACAINE IN DEXTROSE 0.75-8.25 % IT SOLN
INTRATHECAL | Status: DC | PRN
  Administered 2024-04-02: 1.6 mL via INTRATHECAL

## 2024-04-02 MED ORDER — MIDAZOLAM HCL 5 MG/5ML IJ SOLN
INTRAMUSCULAR | Status: DC | PRN
  Administered 2024-04-02 (×2): 1 mg via INTRAVENOUS

## 2024-04-02 MED ORDER — FENTANYL CITRATE (PF) 100 MCG/2ML IJ SOLN
INTRAMUSCULAR | Status: AC
  Filled 2024-04-02: qty 2

## 2024-04-02 MED ORDER — BUPIVACAINE-EPINEPHRINE (PF) 0.5% -1:200000 IJ SOLN
INTRAMUSCULAR | Status: AC
  Filled 2024-04-02: qty 30

## 2024-04-02 MED ORDER — ACETAMINOPHEN 10 MG/ML IV SOLN: 1000.0000 mg | Freq: Once | INTRAVENOUS | Status: DC | PRN

## 2024-04-02 MED ORDER — PROPOFOL 1000 MG/100ML IV EMUL
INTRAVENOUS | Status: AC
  Filled 2024-04-02: qty 100

## 2024-04-02 MED ORDER — OXYCODONE HCL 5 MG PO TABS
10.0000 mg | ORAL_TABLET | ORAL | Status: DC | PRN
  Administered 2024-04-02 – 2024-04-04 (×6): 10 mg via ORAL
  Filled 2024-04-02 (×4): qty 2

## 2024-04-02 MED ORDER — MENTHOL 3 MG MT LOZG: 1.0000 | LOZENGE | OROMUCOSAL | Status: DC | PRN

## 2024-04-02 MED ORDER — MIDAZOLAM HCL 2 MG/2ML IJ SOLN
INTRAMUSCULAR | Status: AC
  Filled 2024-04-02: qty 2

## 2024-04-02 MED ORDER — BISACODYL 5 MG PO TBEC: 5.0000 mg | DELAYED_RELEASE_TABLET | Freq: Every day | ORAL | Status: DC | PRN

## 2024-04-02 MED ORDER — CEFAZOLIN SODIUM-DEXTROSE 2-4 GM/100ML-% IV SOLN
2.0000 g | INTRAVENOUS | Status: AC
  Administered 2024-04-02: 2 g via INTRAVENOUS
  Filled 2024-04-02: qty 100

## 2024-04-02 MED ORDER — CEFAZOLIN SODIUM-DEXTROSE 2-4 GM/100ML-% IV SOLN
2.0000 g | Freq: Three times a day (TID) | INTRAVENOUS | Status: AC
  Administered 2024-04-02: 2 g via INTRAVENOUS
  Filled 2024-04-02: qty 100

## 2024-04-02 MED ORDER — METFORMIN HCL 500 MG PO TABS
500.0000 mg | ORAL_TABLET | Freq: Two times a day (BID) | ORAL | Status: DC
  Administered 2024-04-02 – 2024-04-04 (×4): 500 mg via ORAL
  Filled 2024-04-02 (×4): qty 1

## 2024-04-02 MED ORDER — DOCUSATE SODIUM 100 MG PO CAPS
100.0000 mg | ORAL_CAPSULE | Freq: Two times a day (BID) | ORAL | Status: DC
  Administered 2024-04-02 – 2024-04-04 (×5): 100 mg via ORAL
  Filled 2024-04-02 (×5): qty 1

## 2024-04-02 MED ORDER — ASPIRIN 325 MG PO TBEC
325.0000 mg | DELAYED_RELEASE_TABLET | Freq: Every day | ORAL | Status: DC
  Administered 2024-04-03 – 2024-04-04 (×2): 325 mg via ORAL
  Filled 2024-04-02 (×2): qty 1

## 2024-04-02 MED ORDER — FENTANYL CITRATE (PF) 100 MCG/2ML IJ SOLN
INTRAMUSCULAR | Status: DC | PRN
  Administered 2024-04-02 (×2): 50 ug via INTRAVENOUS

## 2024-04-02 MED ORDER — ALBUTEROL SULFATE (2.5 MG/3ML) 0.083% IN NEBU: 2.5000 mg | INHALATION_SOLUTION | RESPIRATORY_TRACT | Status: DC | PRN

## 2024-04-02 MED ORDER — ONDANSETRON HCL 4 MG/2ML IJ SOLN
4.0000 mg | Freq: Four times a day (QID) | INTRAMUSCULAR | Status: DC | PRN
  Administered 2024-04-03: 4 mg via INTRAVENOUS
  Filled 2024-04-02: qty 2

## 2024-04-02 MED ORDER — SODIUM CHLORIDE (PF) 0.9 % IJ SOLN
INTRAMUSCULAR | Status: AC
  Filled 2024-04-02: qty 50

## 2024-04-02 MED ORDER — SODIUM CHLORIDE (PF) 0.9 % IJ SOLN
INTRAMUSCULAR | Status: DC | PRN
  Administered 2024-04-02: 100 mL

## 2024-04-02 MED ORDER — ORAL CARE MOUTH RINSE: 15.0000 mL | Freq: Once | OROMUCOSAL | Status: AC

## 2024-04-02 MED ORDER — PANTOPRAZOLE SODIUM 40 MG PO TBEC
40.0000 mg | DELAYED_RELEASE_TABLET | Freq: Every day | ORAL | Status: DC
  Administered 2024-04-02 – 2024-04-04 (×3): 40 mg via ORAL
  Filled 2024-04-02 (×3): qty 1

## 2024-04-02 MED ORDER — TRANEXAMIC ACID-NACL 1000-0.7 MG/100ML-% IV SOLN
1000.0000 mg | INTRAVENOUS | Status: AC
  Administered 2024-04-02: 1000 mg via INTRAVENOUS
  Filled 2024-04-02: qty 100

## 2024-04-02 MED ORDER — PROPOFOL 10 MG/ML IV BOLUS
INTRAVENOUS | Status: DC | PRN
  Administered 2024-04-02: 20 mg via INTRAVENOUS

## 2024-04-02 MED ORDER — ONDANSETRON HCL 4 MG/2ML IJ SOLN
INTRAMUSCULAR | Status: DC | PRN
  Administered 2024-04-02: 4 mg via INTRAVENOUS

## 2024-04-02 MED ORDER — POLYETHYLENE GLYCOL 3350 17 G PO PACK: 17.0000 g | PACK | Freq: Every day | ORAL | Status: DC | PRN

## 2024-04-02 MED ORDER — BUPIVACAINE LIPOSOME 1.3 % IJ SUSP
INTRAMUSCULAR | Status: AC
  Filled 2024-04-02: qty 20

## 2024-04-02 MED ORDER — WATER FOR IRRIGATION, STERILE IR SOLN
Status: DC | PRN
  Administered 2024-04-02: 2000 mL

## 2024-04-02 MED ORDER — METOPROLOL SUCCINATE ER 50 MG PO TB24
50.0000 mg | ORAL_TABLET | Freq: Every morning | ORAL | Status: DC
  Administered 2024-04-04: 50 mg via ORAL
  Filled 2024-04-02 (×3): qty 1

## 2024-04-02 MED ORDER — OXYCODONE HCL 5 MG PO TABS: 5.0000 mg | ORAL_TABLET | Freq: Once | ORAL | Status: DC | PRN

## 2024-04-02 MED ORDER — KCL IN DEXTROSE-NACL 20-5-0.45 MEQ/L-%-% IV SOLN
INTRAVENOUS | Status: DC
  Filled 2024-04-02 (×6): qty 1000

## 2024-04-02 MED ORDER — ACETAMINOPHEN 325 MG PO TABS
325.0000 mg | ORAL_TABLET | Freq: Four times a day (QID) | ORAL | Status: DC | PRN
  Administered 2024-04-04: 650 mg via ORAL
  Filled 2024-04-02: qty 2

## 2024-04-02 MED ORDER — SODIUM CHLORIDE 0.9 % IR SOLN
Status: DC | PRN
  Administered 2024-04-02: 1000 mL

## 2024-04-02 MED ORDER — EPHEDRINE SULFATE-NACL 50-0.9 MG/10ML-% IV SOSY
PREFILLED_SYRINGE | INTRAVENOUS | Status: DC | PRN
  Administered 2024-04-02 (×4): 5 mg via INTRAVENOUS

## 2024-04-02 MED ORDER — PROPOFOL 500 MG/50ML IV EMUL
INTRAVENOUS | Status: DC | PRN
  Administered 2024-04-02: 100 ug/kg/min via INTRAVENOUS

## 2024-04-02 MED ORDER — OXYCODONE HCL 5 MG/5ML PO SOLN: 5.0000 mg | Freq: Once | ORAL | Status: DC | PRN

## 2024-04-02 MED ORDER — DULOXETINE HCL 30 MG PO CPEP
30.0000 mg | ORAL_CAPSULE | Freq: Every morning | ORAL | Status: DC
  Administered 2024-04-03 – 2024-04-04 (×2): 30 mg via ORAL
  Filled 2024-04-02 (×3): qty 1

## 2024-04-02 MED ORDER — METOCLOPRAMIDE HCL 5 MG PO TABS: 5.0000 mg | ORAL_TABLET | Freq: Three times a day (TID) | ORAL | Status: DC | PRN

## 2024-04-02 MED ORDER — METHOCARBAMOL 1000 MG/10ML IJ SOLN: 500.0000 mg | Freq: Four times a day (QID) | INTRAMUSCULAR | Status: DC | PRN

## 2024-04-02 MED ORDER — LORATADINE 10 MG PO TABS
10.0000 mg | ORAL_TABLET | Freq: Every day | ORAL | Status: DC
  Administered 2024-04-03 – 2024-04-04 (×2): 10 mg via ORAL
  Filled 2024-04-02 (×3): qty 1

## 2024-04-02 MED ORDER — INSULIN ASPART 100 UNIT/ML IJ SOLN
0.0000 [IU] | Freq: Three times a day (TID) | INTRAMUSCULAR | Status: DC
  Administered 2024-04-02: 5 [IU] via SUBCUTANEOUS
  Administered 2024-04-03: 3 [IU] via SUBCUTANEOUS
  Administered 2024-04-03 – 2024-04-04 (×3): 2 [IU] via SUBCUTANEOUS

## 2024-04-02 MED ORDER — FENTANYL CITRATE (PF) 50 MCG/ML IJ SOSY: 25.0000 ug | PREFILLED_SYRINGE | INTRAMUSCULAR | Status: DC | PRN

## 2024-04-02 MED ORDER — ACETAMINOPHEN 500 MG PO TABS
1000.0000 mg | ORAL_TABLET | Freq: Four times a day (QID) | ORAL | Status: AC
  Administered 2024-04-02 – 2024-04-03 (×4): 1000 mg via ORAL
  Filled 2024-04-02 (×4): qty 2

## 2024-04-02 MED ORDER — METOCLOPRAMIDE HCL 5 MG/ML IJ SOLN: 5.0000 mg | Freq: Three times a day (TID) | INTRAMUSCULAR | Status: DC | PRN

## 2024-04-02 MED ORDER — DEXAMETHASONE SOD PHOSPHATE PF 10 MG/ML IJ SOLN: 10.0000 mg | Freq: Once | INTRAMUSCULAR | Status: DC

## 2024-04-02 MED ORDER — FAMOTIDINE 20 MG PO TABS
20.0000 mg | ORAL_TABLET | Freq: Two times a day (BID) | ORAL | Status: DC
  Administered 2024-04-02 – 2024-04-04 (×5): 20 mg via ORAL
  Filled 2024-04-02 (×5): qty 1

## 2024-04-02 MED ORDER — DEXAMETHASONE SOD PHOSPHATE PF 10 MG/ML IJ SOLN
INTRAMUSCULAR | Status: DC | PRN
  Administered 2024-04-02: 4 mg via INTRAVENOUS

## 2024-04-02 SURGICAL SUPPLY — 50 items
ATTUNE MED DOME PAT 38 KNEE (Knees) IMPLANT
ATTUNE PS FEM LT SZ 4 CEM KNEE (Femur) IMPLANT
ATTUNE PSRP INSR SZ4 6 KNEE (Insert) IMPLANT
BAG COUNTER SPONGE SURGICOUNT (BAG) IMPLANT
BAG ZIPLOCK 12X15 (MISCELLANEOUS) ×1 IMPLANT
BASEPLATE TIBIAL ROTATING SZ 4 (Knees) IMPLANT
BLADE SAGITTAL 25.0X1.19X90 (BLADE) ×1 IMPLANT
BLADE SAW SGTL 13.0X1.19X90.0M (BLADE) ×1 IMPLANT
BNDG ELASTIC 6INX 5YD STR LF (GAUZE/BANDAGES/DRESSINGS) ×1 IMPLANT
BNDG ELASTIC 6X10 VLCR STRL LF (GAUZE/BANDAGES/DRESSINGS) IMPLANT
BOOTIES KNEE HIGH SLOAN (MISCELLANEOUS) ×1 IMPLANT
BOWL SMART MIX CTS (DISPOSABLE) ×1 IMPLANT
CEMENT HV SMART SET (Cement) ×2 IMPLANT
COVER SURGICAL LIGHT HANDLE (MISCELLANEOUS) ×1 IMPLANT
CUFF TRNQT CYL 34X4.125X (TOURNIQUET CUFF) ×1 IMPLANT
DERMABOND ADVANCED .7 DNX12 (GAUZE/BANDAGES/DRESSINGS) ×1 IMPLANT
DRAPE U-SHAPE 47X51 STRL (DRAPES) ×1 IMPLANT
DRESSING AQUACEL AG SP 3.5X10 (GAUZE/BANDAGES/DRESSINGS) IMPLANT
DRSG AQUACEL AG ADV 3.5X 4 (GAUZE/BANDAGES/DRESSINGS) IMPLANT
DRSG AQUACEL AG ADV 3.5X10 (GAUZE/BANDAGES/DRESSINGS) ×1 IMPLANT
DURAPREP 26ML APPLICATOR (WOUND CARE) ×1 IMPLANT
ELECT PENCIL ROCKER SW 15FT (MISCELLANEOUS) ×1 IMPLANT
ELECT REM PT RETURN 15FT ADLT (MISCELLANEOUS) ×1 IMPLANT
GLOVE BIOGEL PI IND STRL 8 (GLOVE) ×2 IMPLANT
GLOVE BIOGEL PI IND STRL 8.5 (GLOVE) IMPLANT
GLOVE ECLIPSE 7.5 STRL STRAW (GLOVE) ×2 IMPLANT
GLOVE ECLIPSE 9.0 STRL (GLOVE) IMPLANT
GOWN STRL REUS W/ TWL XL LVL3 (GOWN DISPOSABLE) ×2 IMPLANT
HOOD PEEL AWAY T7 (MISCELLANEOUS) ×3 IMPLANT
KIT TURNOVER KIT A (KITS) ×1 IMPLANT
MANIFOLD NEPTUNE II (INSTRUMENTS) ×1 IMPLANT
NDL HYPO 22X1.5 SAFETY MO (MISCELLANEOUS) ×2 IMPLANT
NEEDLE HYPO 22X1.5 SAFETY MO (MISCELLANEOUS) ×2 IMPLANT
NS IRRIG 1000ML POUR BTL (IV SOLUTION) ×1 IMPLANT
PACK TOTAL KNEE CUSTOM (KITS) ×1 IMPLANT
PADDING CAST COTTON 6X4 STRL (CAST SUPPLIES) ×1 IMPLANT
PIN STEINMAN FIXATION KNEE (PIN) IMPLANT
PROTECTOR NERVE ULNAR (MISCELLANEOUS) ×1 IMPLANT
SET HNDPC FAN SPRY TIP SCT (DISPOSABLE) ×1 IMPLANT
SPIKE FLUID TRANSFER (MISCELLANEOUS) ×2 IMPLANT
SUT MNCRL AB 3-0 PS2 18 (SUTURE) ×1 IMPLANT
SUT VIC AB 0 CT1 36 (SUTURE) ×2 IMPLANT
SUT VIC AB 1 CT1 36 (SUTURE) ×2 IMPLANT
SUT VIC AB 2-0 CT1 TAPERPNT 27 (SUTURE) ×1 IMPLANT
SUT VICRYL+ 3-0 36IN CT-1 (SUTURE) IMPLANT
SYR CONTROL 10ML LL (SYRINGE) ×2 IMPLANT
TRAY CATH INTERMITTENT SS 16FR (CATHETERS) IMPLANT
TUBE SUCTION HIGH CAP CLEAR NV (SUCTIONS) ×1 IMPLANT
WATER STERILE IRR 1000ML POUR (IV SOLUTION) ×2 IMPLANT
WRAP KNEE MAXI GEL POST OP (GAUZE/BANDAGES/DRESSINGS) ×1 IMPLANT

## 2024-04-02 NOTE — Progress Notes (Signed)
 Orthopedic Tech Progress Note Patient Details:  Gerica Koble March 28, 1956 [id number] Applied bone foam per order.  Ortho Devices Type of Ortho Device: Bone foam zero knee Ortho Device/Splint Location: LLE Ortho Device/Splint Interventions: Ordered, Adjustment, Application   Post Interventions Patient Tolerated: Well Instructions Provided: Adjustment of device, Care of device, Poper ambulation with device  Morna Pink 04/02/2024, 10:10 AM

## 2024-04-02 NOTE — Progress Notes (Signed)
   04/02/24 2249  BiPAP/CPAP/SIPAP  BiPAP/CPAP/SIPAP Pt Type Adult  BiPAP/CPAP/SIPAP Resmed  Mask Type Nasal mask  Dentures removed? Not applicable  Mask Size Medium  EPAP 9 cmH2O (per pt)  FiO2 (%) 21 %  Patient Home Machine No  Patient Home Mask Yes  Patient Home Tubing Yes  Auto Titrate No  Device Plugged into RED Power Outlet Yes

## 2024-04-02 NOTE — Anesthesia Procedure Notes (Signed)
 Anesthesia Regional Block: Adductor canal block   Pre-Anesthetic Checklist: , timeout performed,  Correct Patient, Correct Site, Correct Laterality,  Correct Procedure, Correct Position, site marked,  Risks and benefits discussed,  Surgical consent,  Pre-op evaluation,  At surgeon's request and post-op pain management  Laterality: Lower and Left  Prep: chloraprep       Needles:  Injection technique: Single-shot  Needle Type: Echogenic Stimulator Needle     Needle Length: 10cm  Needle Gauge: 20     Additional Needles:   Procedures:,,,, ultrasound used (permanent image in chart),, #20gu IV placed    Narrative:  Start time: 04/02/2024 7:15 AM End time: 04/02/2024 7:17 AM Injection made incrementally with aspirations every 5 mL.  Performed by: Personally  Anesthesiologist: Waddell Lauraine NOVAK, MD  Additional Notes: Timeout performed with bedside RN - Name, DOB, allergies and laterality confirmed by the patient and RN. Surgical marking performed/confirmed. Anticoagulation status and most recent platelet count reviewed. Patient placed in a frog-leg position and sedation (as documented by RN) given via PIV. Peripheral nerve block performed as documented above. VSS throughout (see Flowchart).

## 2024-04-02 NOTE — Discharge Instructions (Signed)

## 2024-04-02 NOTE — Anesthesia Procedure Notes (Signed)
 Spinal  Patient location during procedure: OR Start time: 04/02/2024 7:42 AM End time: 04/02/2024 7:42 AM Reason for block: surgical anesthesia Staffing Performed: anesthesiologist  Anesthesiologist: Waddell Lauraine NOVAK, MD Performed by: Waddell Lauraine NOVAK, MD Authorized by: Waddell Lauraine NOVAK, MD   Preanesthetic Checklist Completed: patient identified, IV checked, site marked, risks and benefits discussed, surgical consent, monitors and equipment checked, pre-op evaluation and timeout performed Spinal Block Patient position: sitting Prep: DuraPrep Patient monitoring: heart rate, cardiac monitor, continuous pulse ox and blood pressure Approach: midline Location: L3-4 Injection technique: single-shot Needle Needle type: Pencan  Needle gauge: 24 G Needle length: 9 cm Assessment Sensory level: Pending. Events: CSF return Additional Notes Patient identified. Risks/Benefits/Options discussed with patient including but not limited to bleeding, infection, nerve damage, paralysis, failed block, blood pressure changes. Confirmed with bedside CRNA the patient's most recent platelet count. Confirmed with patient that they are not currently taking any anticoagulation, have any bleeding history or any family history of bleeding disorders. Sterile technique was used throughout the entire procedure as documented above. See intraoperative record for vital signs throughout.   CSF aspirated before and after injection.  CANDIE Mcbride, MD

## 2024-04-02 NOTE — Transfer of Care (Signed)
 Immediate Anesthesia Transfer of Care Note  Patient: Stefanie Campbell  Procedure(s) Performed: ARTHROPLASTY, KNEE, TOTAL (Left: Knee)  Patient Location: PACU  Anesthesia Type:Spinal  Level of Consciousness: awake, alert , and oriented  Airway & Oxygen Therapy: Patient Spontanous Breathing and Patient connected to face mask oxygen  Post-op Assessment: Report given to RN and Post -op Vital signs reviewed and stable  Post vital signs: Reviewed and stable  Last Vitals:  Vitals Value Taken Time  BP 123/62 04/02/24 09:46  Temp    Pulse 70 04/02/24 09:54  Resp 15 04/02/24 09:54  SpO2 100 % 04/02/24 09:54  Vitals shown include unfiled device data.  Last Pain:  Vitals:   04/02/24 0551  TempSrc: Oral         Complications: No notable events documented.

## 2024-04-02 NOTE — Evaluation (Signed)
 Physical Therapy Evaluation Patient Details Name: Stefanie Campbell MRN: [id number] DOB: [birth year] Today's Date: 04/02/2024  History of Present Illness  68 yo female s/p R TKA  on 04/02/24. PMH: Anemia, CKD, obesity, HTN, liver dz, LE edema, PAF, venous insufficiency, Graves dz, syncope, DVT  Clinical Impression  Pt is s/p TKA resulting in the deficits listed below (see PT Problem List).  Pt motivated to work with PT, pain controlled.  Amb 98'  with RW and min assist, distance ltd by dizziness. Dizziness resolved quickly with seated rest, ankle pumps x2 while reclined. RN aware.  Pt will benefit from acute skilled PT to increase their independence and safety with mobility to allow discharge.          If plan is discharge home, recommend the following: A little help with walking and/or transfers;A little help with bathing/dressing/bathroom;Help with stairs or ramp for entrance;Assist for transportation;Assistance with cooking/housework   Can travel by private Manufacturing systems engineer walker (2 wheels) (youth ht)  Recommendations for Other Services       Functional Status Assessment Patient has had a recent decline in their functional status and demonstrates the ability to make significant improvements in function in a reasonable and predictable amount of time.     Precautions / Restrictions Precautions Precautions: Fall;Knee Restrictions Weight Bearing Restrictions Per Provider Order: No Other Position/Activity Restrictions: WBAT      Mobility  Bed Mobility Overal bed mobility: Needs Assistance Bed Mobility: Supine to Sit     Supine to sit: Min assist     General bed mobility comments: incr time and effort, assist to elevate trunk    Transfers Overall transfer level: Needs assistance Equipment used: Rolling walker (2 wheels) Transfers: Sit to/from Stand Sit to Stand: Min assist           General transfer comment: cues for hand  placement and RLE positon, assist to rise and transition to RW    Ambulation/Gait Ambulation/Gait assistance: Min assist Gait Distance (Feet): 11 Feet Assistive device: Rolling walker (2 wheels) Gait Pattern/deviations: Step-to pattern       General Gait Details: cues for sequence, step to pattern and RW position, use of UEs to offload RLE d/t initial R knee instability in stance phase--improved with tactile cues  Stairs            Wheelchair Mobility     Tilt Bed    Modified Rankin (Stroke Patients Only)       Balance Overall balance assessment: Needs assistance Sitting-balance support: No upper extremity supported, Feet supported Sitting balance-Leahy Scale: Fair     Standing balance support: During functional activity, Reliant on assistive device for balance, Bilateral upper extremity supported Standing balance-Leahy Scale: Poor                               Pertinent Vitals/Pain Pain Assessment Pain Assessment: Faces Faces Pain Scale: No hurt Pain Intervention(s): Limited activity within patient's tolerance, Monitored during session, Premedicated before session    Home Living Family/patient expects to be discharged to:: Private residence Living Arrangements: Spouse/significant other Available Help at Discharge: Family Type of Home: House Home Access: Stairs to enter Entrance Stairs-Rails: None Entrance Stairs-Number of Steps: 3   Home Layout: One level;Laundry or work area in [employer] Equipment: Engineer, materials - single point;BSC/3in1      Prior Function Prior Level of Function : Independent/Modified Independent  Mobility Comments: amb with cane ADLs Comments: ind     Extremity/Trunk Assessment   Upper Extremity Assessment Upper Extremity Assessment: Overall WFL for tasks assessed    Lower Extremity Assessment Lower Extremity Assessment: RLE deficits/detail RLE Deficits / Details: ankle WFL, (some  numbness in foot reported, likely d/t residual effects of anesthesia). knee extension with 10 degree quad lag, hip grossly 3+/5       Communication   Communication Communication: No apparent difficulties    Cognition Arousal: Alert Behavior During Therapy: WFL for tasks assessed/performed   PT - Cognitive impairments: No apparent impairments                         Following commands: Intact       Cueing Cueing Techniques: Verbal cues     General Comments      Exercises Total Joint Exercises Ankle Circles/Pumps: AROM, Both, 5 reps   Assessment/Plan    PT Assessment Patient needs continued PT services  PT Problem List Decreased strength;Decreased activity tolerance;Decreased knowledge of use of DME;Decreased mobility;Decreased balance;Decreased range of motion       PT Treatment Interventions DME instruction;Therapeutic exercise;Gait training;Functional mobility training;Therapeutic activities;Patient/family education;Stair training    PT Goals (Current goals can be found in the Care Plan section)  Acute Rehab PT Goals PT Goal Formulation: With patient Time For Goal Achievement: 04/09/24 Potential to Achieve Goals: Good    Frequency 7X/week     Co-evaluation               AM-PAC PT 6 Clicks Mobility  Outcome Measure Help needed turning from your back to your side while in a flat bed without using bedrails?: A Little Help needed moving from lying on your back to sitting on the side of a flat bed without using bedrails?: A Little Help needed moving to and from a bed to a chair (including a wheelchair)?: A Little Help needed standing up from a chair using your arms (e.g., wheelchair or bedside chair)?: A Little Help needed to walk in hospital room?: A Little Help needed climbing 3-5 steps with a railing? : A Lot 6 Click Score: 17    End of Session Equipment Utilized During Treatment: Gait belt Activity Tolerance: Patient tolerated treatment  well;Treatment limited secondary to medical complications (Comment) (dizziness) Patient left: in chair;with chair alarm set;with call bell/phone within reach Nurse Communication: Mobility status PT Visit Diagnosis: Other abnormalities of gait and mobility (R26.89);Difficulty in walking, not elsewhere classified (R26.2)    Time: 8466-8446 PT Time Calculation (min) (ACUTE ONLY): 20 min   Charges:   PT Evaluation $PT Eval Low Complexity: 1 Low   PT General Charges $$ ACUTE PT VISIT: 1 Visit         Swayze Pries, PT  Acute Rehab Dept ([facility]) [phone]  04/02/2024   [facility] 04/02/2024, 4:08 PM

## 2024-04-02 NOTE — Interval H&P Note (Signed)
 History and Physical Interval Note:  04/02/2024 8:53 AM  Stefanie Campbell  has presented today for surgery, with the diagnosis of LEFT KNEE DEGENERATIVE JOINT DISEASE.  The various methods of treatment have been discussed with the patient and family. After consideration of risks, benefits and other options for treatment, the patient has consented to  Procedure(s): ARTHROPLASTY, KNEE, TOTAL (Left) as a surgical intervention.  The patient's history has been reviewed, patient examined, no change in status, stable for surgery.  I have reviewed the patient's chart and labs.  Questions were answered to the patient's satisfaction.     Norleen LITTIE Gavel

## 2024-04-02 NOTE — Anesthesia Postprocedure Evaluation (Signed)
 Anesthesia Post Note  Patient: Stefanie Campbell  Procedure(s) Performed: ARTHROPLASTY, KNEE, TOTAL (Left: Knee)     Patient location during evaluation: PACU Anesthesia Type: Spinal and Regional Level of consciousness: awake Pain management: pain level controlled Vital Signs Assessment: post-procedure vital signs reviewed and stable Respiratory status: spontaneous breathing Cardiovascular status: blood pressure returned to baseline Postop Assessment: no apparent nausea or vomiting and spinal receding Anesthetic complications: no   No notable events documented.                  Lauraine KATHEE [place]

## 2024-04-02 NOTE — Op Note (Signed)
 PATIENT ID:      Stefanie Campbell  MRN:     [id number] DOB/AGE:    1956/04/13 / 68 y.o.       OPERATIVE REPORT   DATE OF PROCEDURE:  04/02/2024      PREOPERATIVE DIAGNOSIS:   LEFT KNEE DEGENERATIVE JOINT DISEASE      Estimated body mass index is 37.73 kg/m as calculated from the following:   Height as of this encounter: 5' 3 (1.6 m).   Weight as of this encounter: 96.6 kg.                                                       POSTOPERATIVE DIAGNOSIS:   Same                                                                  PROCEDURE:  Procedure(s): ARTHROPLASTY, KNEE, TOTAL Using DepuyAttune RP implants #4 Femur, #4Tibia, 6 mm Attune RP bearing, 38 Patella    SURGEON: Norleen LITTIE Gavel  ASSISTANT:   Camellia POUR. [employer]   (Present and scrubbed throughout the case, critical for assistance with exposure, retraction, instrumentation, and closure.)        ANESTHESIA: spinal, 20cc Exparel, 50cc 0.25% Marcaine EBL: min cc FLUID REPLACEMENT: unk cc crystaloid TOURNIQUET: none DRAINS: None TRANEXAMIC ACID: 1gm IV, 2gm topical COMPLICATIONS:  None         INDICATIONS FOR PROCEDURE: The patient has  LEFT KNEE DEGENERATIVE JOINT DISEASE, varus deformities, XR shows bone on bone arthritis, lateral subluxation of tibia. Patient has failed all conservative measures including anti-inflammatory medicines, narcotics, attempts at exercise and weight loss, cortisone injections and viscosupplementation.  Risks and benefits of surgery have been discussed, questions answered.   DESCRIPTION OF PROCEDURE: The patient identified by armband, received  IV antibiotics, in the holding area at [facility]. Patient taken to the operating room, appropriate anesthetic monitors were attached, and spinal anesthesia was  induced. IV Tranexamic acid was given. Lateral post and 2 surefoot positioners applied to the table, the lower extremity was then prepped and draped in usual sterile fashion from the  toes to the high thigh. Time-out procedure was performed. Camellia POUR. [facility] PAC, was present and scrubbed throughout the case, critical for assistance with, positioning, exposure, retraction, instrumentation, and closure.The skin and subcutaneous tissue along the incision was injected with 20 cc of a mixture of 20cc Exparel and 30cc Marcaine 50cc saline solution, using a 21-gauge by 1-1/2 inch needle. We began the operation, with the knee flexed 130 degrees, by making the anterior midline incision starting at handbreadth above the patella going over the patella 1 cm medial to and 4 cm distal to the tibial tubercle. Small bleeders in the skin and the subcutaneous tissue identified and cauterized. Transverse retinaculum was incised and reflected medially and a medial parapatellar arthrotomy was accomplished. the patella was everted and theprepatellar fat pad resected. The superficial medial collateral ligament was then elevated from anterior to posterior along the proximal flare of the tibia and anterior half of the menisci resected. The knee was hyperflexed exposing  bone on bone arthritis. Peripheral and notch osteophytes as well as the cruciate ligaments were then resected. We continued to work our way around posteriorly along the proximal tibia, and externally rotated the tibia subluxing it out from underneath the femur. A McHale PCL retractor was placed through the notch, a lateral Hohmann retractor, and anterolateral small homan retractor placed. We then entered the proximal tibia with the Depuy starter drill in line with the axis of the tibia followed by an intramedullary guide rod and 3 degree posterior slope cutting guide. The tibial cutting guide, was pinned into place allowing resection of 2 mm of bone medially and 10 mm of bone laterally. Satisfied with the tibial resection, we then entered the distal femur 2 mm anterior to the PCL origin with the starter drill, followed by the intramedullary guide rod and  applied the distal femoral cutting guide set at 9 mm, with 5 degrees of valgus. This was pinned along the epicondylar axis. At this point, the distal femoral cut was accomplished without difficulty. We then sized for a #4 femoral component and pinned the chamfer guide in 3 degrees of external rotation. The anterior, posterior, and chamfer cuts were accomplished without difficulty followed by the Attune RP box cutting guide and the box cut. We also removed posterior osteophytes from the posterior femoral condyles. The posterior capsule was injected with Exparel solution. The knee was brought into full extension. We checked our extension gap and fit a 6 mm trial lollipop. Distracting in extension with a lamina spreader,  bleeders in the posterior capsule, Posterior medial and posterior lateral gutter were cauterized.  The transexamic acid-soaked sponge was then placed in the gap of the knee in extension. The knee was flexed 30. The posterior patella cut was accomplished with the 9.5 mm Attune cutting guide, sized for a 38 mm dome, and the fixation pegs drilled.The knee was then once again hyperflexed exposing the proximal tibia. We sized for a # 4 tibial base plate, applied the smokestack and the conical reamer followed by the the Delta fin keel punch. We then hammered into place the Attune RP trial femoral component, drilled the lugs, inserted a  6 mm trial bearing, trial patellar button, and took the knee through range of motion from 0-130 degrees. Medial and lateral ligamentous stability was checked. No thumb pressure was required for patellar Tracking.  All trial components were removed, mating surfaces irrigated with pulse lavage, and dried with suction and sponges. Exparel solution was applied to the cancellus bone of the patella distal femur and proximal tibia.  After waiting 30 seconds, the bony surfaces were again, dried with sponges. A double batch of DePuy HV cement was mixed and applied to all bony  metallic mating surfaces except for the posterior condyles of the femur itself. In order, we hammered into place the tibial tray and removed excess cement, the femoral component and removed excess cement. The final Attune RP bearing was inserted, and the knee brought to full extension with compression. The patellar button was clamped into place, and excess cement removed. The knee was held at 30 flexion with compression using the second surefoot, while the cement cured. The wound was irrigated out with normal saline solution pulse lavage. The rest of the Exparel was injected into the parapatellar arthrotomy, subcutaneous tissues, and periosteal tissues. The parapatellar arthrotomy was closed with running #1 Vicryl suture. The subcutaneous tissue with 3-0 undyed Vicryl suture, and the skin with running 3-0 SQ vicryl. An Aquacil dressing and  Ace wrap were applied. The patient was taken to recovery room without difficulty.   Norleen LITTIE Gavel 04/02/2024, 9:06 AM

## 2024-04-02 NOTE — Interval H&P Note (Signed)
 History and Physical Interval Note:  04/02/2024 8:52 AM  Stefanie Campbell  has presented today for surgery, with the diagnosis of LEFT KNEE DEGENERATIVE JOINT DISEASE.  The various methods of treatment have been discussed with the patient and family. After consideration of risks, benefits and other options for treatment, the patient has consented to  Procedure(s): ARTHROPLASTY, KNEE, TOTAL (Left) as a surgical intervention.  The patient's history has been reviewed, patient examined, no change in status, stable for surgery.  I have reviewed the patient's chart and labs.  Questions were answered to the patient's satisfaction.     Norleen LITTIE Gavel

## 2024-04-03 DIAGNOSIS — M1712 Unilateral primary osteoarthritis, left knee: Secondary | ICD-10-CM | POA: Diagnosis not present

## 2024-04-03 LAB — GLUCOSE, CAPILLARY
Glucose-Capillary: 129 mg/dL — ABNORMAL HIGH (ref 70–99)
Glucose-Capillary: 88 mg/dL (ref 70–99)
Glucose-Capillary: 160 mg/dL — ABNORMAL HIGH (ref 70–99)
Glucose-Capillary: 187 mg/dL — ABNORMAL HIGH (ref 70–99)

## 2024-04-03 MED ORDER — DOCUSATE SODIUM 100 MG PO CAPS: 100.0000 mg | ORAL_CAPSULE | Freq: Two times a day (BID) | ORAL | 2 refills | Status: AC

## 2024-04-03 MED ORDER — ASPIRIN 325 MG PO TBEC: 325.0000 mg | DELAYED_RELEASE_TABLET | Freq: Two times a day (BID) | ORAL | 0 refills | Status: AC

## 2024-04-03 MED ORDER — OXYCODONE-ACETAMINOPHEN 5-325 MG PO TABS: 1.0000 | ORAL_TABLET | ORAL | 0 refills | Status: AC | PRN

## 2024-04-03 NOTE — Progress Notes (Signed)
   04/03/24 2240  BiPAP/CPAP/SIPAP  BiPAP/CPAP/SIPAP Pt Type Adult  BiPAP/CPAP/SIPAP Resmed  Mask Type Nasal mask  Dentures removed? Not applicable  EPAP 9 cmH2O  FiO2 (%) 21 %  Patient Home Machine No  Patient Home Mask Yes  Patient Home Tubing Yes  Auto Titrate No  Device Plugged into RED Power Outlet Yes

## 2024-04-03 NOTE — Progress Notes (Signed)
 Physical Therapy Treatment Patient Details Name: Stefanie Campbell MRN: [id number] DOB: [birth year] Today's Date: 04/03/2024   History of Present Illness 68 yo female s/p R TKA  on 04/02/24. PMH: Anemia, CKD, obesity, HTN, liver dz, LE edema, PAF, venous insufficiency, Graves dz, syncope, DVT    PT Comments  Pt progressing, tolerated incr gait distance however again limited by dizziness with pt requiring seated rest, no LOC. After ~ 1 minute pt was able to stand from rolling desk chair and transition to recliner. NT present, RN made aware   If plan is discharge home, recommend the following: A little help with walking and/or transfers;A little help with bathing/dressing/bathroom;Help with stairs or ramp for entrance;Assist for transportation;Assistance with cooking/housework   Can travel by private Automotive engineer (2 wheels)    Recommendations for Other Services       Precautions / Restrictions Precautions Precautions: Fall;Knee Recall of Precautions/Restrictions: Intact Restrictions Weight Bearing Restrictions Per Provider Order: No Other Position/Activity Restrictions: WBAT     Mobility  Bed Mobility Overal bed mobility: Needs Assistance Bed Mobility: Supine to Sit     Supine to sit: Contact guard, Supervision     General bed mobility comments: incr time and effort, able to complete without physical assist    Transfers Overall transfer level: Needs assistance Equipment used: Rolling walker (2 wheels) Transfers: Sit to/from Stand Sit to Stand: Contact guard assist           General transfer comment: cues for hand placement and RLE positon, CGA to rise and transition to RW    Ambulation/Gait Ambulation/Gait assistance: Contact guard assist, Min assist Gait Distance (Feet): 50 Feet Assistive device: Rolling walker (2 wheels) Gait Pattern/deviations: Step-to pattern       General Gait Details: cues for  initial sequence, good stability with RW; pt c/o dizziness after amb 50' with need for seated therapeutic rest   Stairs             Wheelchair Mobility     Tilt Bed    Modified Rankin (Stroke Patients Only)       Balance   Sitting-balance support: No upper extremity supported, Feet supported Sitting balance-Leahy Scale: Fair     Standing balance support: During functional activity, Reliant on assistive device for balance, Bilateral upper extremity supported Standing balance-Leahy Scale: Poor                              Communication Communication Communication: No apparent difficulties  Cognition Arousal: Alert Behavior During Therapy: WFL for tasks assessed/performed   PT - Cognitive impairments: No apparent impairments                         Following commands: Intact      Cueing Cueing Techniques: Verbal cues  Exercises Total Joint Exercises Ankle Circles/Pumps: AROM, Both, 5 reps Quad Sets: AROM, Both, 5 reps    General Comments        Pertinent Vitals/Pain Pain Assessment Pain Assessment: Faces Pain Score: 5  Pain Location: L knee Pain Descriptors / Indicators: Aching, Discomfort, Grimacing, Guarding Pain Intervention(s): Limited activity within patient's tolerance, Monitored during session, Premedicated before session, Repositioned    Home Living                          Prior Function  PT Goals (current goals can now be found in the care plan section) Acute Rehab PT Goals PT Goal Formulation: With patient Time For Goal Achievement: 04/09/24 Potential to Achieve Goals: Good Progress towards PT goals: Progressing toward goals    Frequency    7X/week      PT Plan      Co-evaluation              AM-PAC PT 6 Clicks Mobility   Outcome Measure  Help needed turning from your back to your side while in a flat bed without using bedrails?: A Little Help needed moving from lying  on your back to sitting on the side of a flat bed without using bedrails?: A Little Help needed moving to and from a bed to a chair (including a wheelchair)?: A Little Help needed standing up from a chair using your arms (e.g., wheelchair or bedside chair)?: A Little Help needed to walk in hospital room?: A Little Help needed climbing 3-5 steps with a railing? : A Little 6 Click Score: 18    End of Session Equipment Utilized During Treatment: Gait belt Activity Tolerance: Treatment limited secondary to medical complications (Comment) (dizziness) Patient left: in chair;with call bell/phone within reach;with chair alarm set;with family/visitor present;with nursing/sitter in room   PT Visit Diagnosis: Other abnormalities of gait and mobility (R26.89);Difficulty in walking, not elsewhere classified (R26.2)     Time: 8897-8876 PT Time Calculation (min) (ACUTE ONLY): 21 min  Charges:    $Gait Training: 8-22 mins PT General Charges $$ ACUTE PT VISIT: 1 Visit                     Dhamar Gregory, PT  Acute Rehab Dept [facility]) [phone]  04/03/2024    [facility] 04/03/2024, 11:32 AM

## 2024-04-03 NOTE — Plan of Care (Signed)
  Problem: Health Behavior/Discharge Planning: Goal: Ability to manage health-related needs will improve Outcome: Progressing   Problem: Clinical Measurements: Goal: Ability to maintain clinical measurements within normal limits will improve Outcome: Progressing Goal: Will remain free from infection Outcome: Progressing Goal: Diagnostic test results will improve Outcome: Progressing Goal: Respiratory complications will improve Outcome: Progressing Goal: Cardiovascular complication will be avoided Outcome: Progressing   Problem: Activity: Goal: Risk for activity intolerance will decrease Outcome: Progressing   Problem: Nutrition: Goal: Adequate nutrition will be maintained Outcome: Progressing   Problem: Coping: Goal: Level of anxiety will decrease Outcome: Progressing   Problem: Elimination: Goal: Will not experience complications related to bowel motility Outcome: Progressing Goal: Will not experience complications related to urinary retention Outcome: Progressing   Problem: Pain Managment: Goal: General experience of comfort will improve and/or be controlled Outcome: Progressing   Problem: Safety: Goal: Ability to remain free from injury will improve Outcome: Progressing   Problem: Skin Integrity: Goal: Risk for impaired skin integrity will decrease Outcome: Progressing   Problem: Education: Goal: Ability to describe self-care measures that may prevent or decrease complications (Diabetes Survival Skills Education) will improve Outcome: Progressing Goal: Individualized Educational Video(s) Outcome: Progressing   Problem: Coping: Goal: Ability to adjust to condition or change in health will improve Outcome: Progressing   Problem: Fluid Volume: Goal: Ability to maintain a balanced intake and output will improve Outcome: Progressing   Problem: Health Behavior/Discharge Planning: Goal: Ability to identify and utilize available resources and services will  improve Outcome: Progressing Goal: Ability to manage health-related needs will improve Outcome: Progressing   Problem: Metabolic: Goal: Ability to maintain appropriate glucose levels will improve Outcome: Progressing   Problem: Nutritional: Goal: Maintenance of adequate nutrition will improve Outcome: Progressing Goal: Progress toward achieving an optimal weight will improve Outcome: Progressing   Problem: Skin Integrity: Goal: Risk for impaired skin integrity will decrease Outcome: Progressing   Problem: Tissue Perfusion: Goal: Adequacy of tissue perfusion will improve Outcome: Progressing   Problem: Education: Goal: Knowledge of the prescribed therapeutic regimen will improve Outcome: Progressing Goal: Individualized Educational Video(s) Outcome: Progressing   Problem: Activity: Goal: Ability to avoid complications of mobility impairment will improve Outcome: Progressing Goal: Range of joint motion will improve Outcome: Progressing   Problem: Clinical Measurements: Goal: Postoperative complications will be avoided or minimized Outcome: Progressing   Problem: Pain Management: Goal: Pain level will decrease with appropriate interventions Outcome: Progressing   Problem: Skin Integrity: Goal: Will show signs of wound healing Outcome: Progressing

## 2024-04-03 NOTE — Progress Notes (Signed)
 PT TX NOTE  04/03/24 1500  PT Visit Information  Last PT Received On 04/03/24  Assistance Needed Pt amb a few steps and then c/o dizziness, feeling as if she might passout. BP 130/68 after reclined, HR 53, SpO2=97% on RA , RN notified. Pt will likely need another day in hospital secondary to ongoing dizziness/medical issues, not meeting PT goals for safe d/c home at this time  History of Present Illness 68 yo female s/p R TKA  on 04/02/24. PMH: Anemia, CKD, obesity, HTN, liver dz, LE edema, PAF, venous insufficiency, Graves dz, syncope, DVT  Precautions  Precautions Fall;Knee  Recall of Precautions/Restrictions Intact  Restrictions  Weight Bearing Restrictions Per Provider Order No  Other Position/Activity Restrictions WBAT  Pain Assessment  Pain Assessment Faces  Pain Score 5  Pain Location L knee  Pain Descriptors / Indicators Aching;Discomfort;Grimacing;Guarding  Pain Intervention(s) Limited activity within patient's tolerance;Monitored during session;Premedicated before session;Repositioned;Ice applied  Cognition  Arousal Alert  Behavior During Therapy WFL for tasks assessed/performed  PT - Cognitive impairments No apparent impairments  Following Commands  Following commands Intact  Cueing  Cueing Techniques Verbal cues  Communication  Communication No apparent difficulties  Bed Mobility  Overal bed mobility Needs Assistance  Bed Mobility Supine to Sit  Supine to sit Contact guard;Supervision  General bed mobility comments incr time and effort, able to complete without physical assist  Transfers  Overall transfer level Needs assistance  Equipment used Rolling walker (2 wheels)  Transfers Sit to/from Stand  Sit to Stand Contact guard assist;Min assist  General transfer comment cues for hand placement and RLE positon, assist to rise and transition to RW  Ambulation/Gait  Ambulation/Gait assistance Min assist  Gait Distance (Feet) 3 Feet  Assistive device Rolling walker (2  wheels)  Gait Pattern/deviations Step-to pattern  General Gait Details pt c/o severe dizziness and feeling as if she may pass out after a few steps, chair pulled to pt for seated rest; BP 130/68 after reclined, HR 53, SpO2=97% on RA  Balance  Sitting-balance support No upper extremity supported;Feet supported  Sitting balance-Leahy Scale Fair  Standing balance support During functional activity;Reliant on assistive device for balance;Bilateral upper extremity supported  Standing balance-Leahy Scale Poor  Total Joint Exercises  Ankle Circles/Pumps AROM;Both;5 reps  Quad Sets AROM;Both;5 reps  Heel Slides AAROM;Left;10 reps  PT - End of Session  Equipment Utilized During Treatment Gait belt  Activity Tolerance Treatment limited secondary to medical complications (Comment) (dizziness)  Patient left in chair;with call bell/phone within reach;with chair alarm set  Nurse Communication Mobility status;Other (comment) (dizziness and VS)   PT - Assessment/Plan  PT Visit Diagnosis Other abnormalities of gait and mobility (R26.89);Difficulty in walking, not elsewhere classified (R26.2)  PT Frequency (ACUTE ONLY) 7X/week  Follow Up Recommendations Follow physician's recommendations for discharge plan and follow up therapies  Patient can return home with the following A little help with walking and/or transfers;A little help with bathing/dressing/bathroom;Help with stairs or ramp for entrance;Assist for transportation;Assistance with cooking/housework  PT equipment Rolling walker (2 wheels)  AM-PAC PT 6 Clicks Mobility Outcome Measure (Version 2)  Help needed turning from your back to your side while in a flat bed without using bedrails? 3  Help needed moving from lying on your back to sitting on the side of a flat bed without using bedrails? 3  Help needed moving to and from a bed to a chair (including a wheelchair)? 3  Help needed standing up from a chair using your  arms (e.g., wheelchair or  bedside chair)? 3  Help needed to walk in hospital room? 3  Help needed climbing 3-5 steps with a railing?  1  6 Click Score 16  Consider Recommendation of Discharge To: Home with [facility]  PT Goal Progression  Progress towards PT goals Progressing toward goals  Acute Rehab PT Goals  PT Goal Formulation With patient  Time For Goal Achievement 04/09/24  Potential to Achieve Goals Good  PT Time Calculation  PT Start Time (ACUTE ONLY) 1450  PT Stop Time (ACUTE ONLY) 1506  PT Time Calculation (min) (ACUTE ONLY) 16 min  PT General Charges  $$ ACUTE PT VISIT 1 Visit  PT Treatments  $Therapeutic Activity 8-22 mins

## 2024-04-03 NOTE — Discharge Summary (Signed)
 Patient ID: Stefanie Campbell MRN: [id number] DOB/AGE: Jun 15, 1956 68 y.o.  Admit date: 04/02/2024 Discharge date: 04/03/2024  Admission Diagnoses:  Principal Problem:   S/P total knee arthroplasty, left   Discharge Diagnoses:  Same  Past Medical History:  Diagnosis Date   Aftercare following surgery 11/15/2017   Allergy    Anemia    Arthritis    Bilateral leg edema 11/15/2019   Blood transfusion without reported diagnosis    allergic and had to stop 15 minutes in    Chest pain 10/06/2019   Chronic pain of right ankle 07/12/2017   Chronic venous insufficiency 07/12/2017   Class 3 severe obesity in adult [facility]) 11/24/2018   Current use of steroid medication 01/26/2019   Depression    Essential hypertension 10/06/2019   Family history of colon cancer    Fatty liver    GERD (gastroesophageal reflux disease)    Giant cell arteritis (HCC) 11/24/2018   Graves disease    History of deep venous thrombosis    History of degenerative joint disease    History of kidney stones    Hyperlipidemia    Hypertension    Liver disease    Low back pain 03/12/2013   Low back strain 02/16/2016   Mixed hyperlipidemia 10/06/2019   Morbid obesity (HCC) 02/26/2020   Osteoarthritis of hip 06/05/2018   Other headache syndrome 01/26/2019   PAF (paroxysmal atrial fibrillation) (HCC) 04/13/2015   Formatting of this note might be different from the original. CHADS2 vasc score= 2 Formatting of this note might be different from the original. Overview:  CHADS2 vasc score= 2   Pain in unspecified knee 03/12/2013   Palpitations 10/06/2019   Pneumonia    Pre-diabetes    Primary osteoarthritis of one hip, right 06/05/2018   Pulmonary embolism (HCC)    Sleep apnea    wears cpap    Stroke (HCC)    mini stroke per pt    Syncope 04/13/2015   Tendonitis of ankle, right 07/12/2017    Surgeries: Procedure(s): ARTHROPLASTY, KNEE, TOTAL on 04/02/2024   Consultants:   Discharged Condition:  Improved  Hospital Course: Stefanie Campbell is an 68 y.o. female who was admitted 04/02/2024 for operative treatment ofS/P total knee arthroplasty, left. Patient has severe unremitting pain that affects sleep, daily activities, and work/hobbies. After pre-op clearance the patient was taken to the operating room on 04/02/2024 and underwent  Procedure(s): ARTHROPLASTY, KNEE, TOTAL.    Patient was given perioperative antibiotics:  Anti-infectives (From admission, onward)    Start     Dose/Rate Route Frequency Ordered Stop   04/02/24 1600  ceFAZolin (ANCEF) IVPB 2g/100 mL premix        2 g 200 mL/hr over 30 Minutes Intravenous Every 8 hours 04/02/24 1251 04/02/24 1718   04/02/24 0600  ceFAZolin (ANCEF) IVPB 2g/100 mL premix        2 g 200 mL/hr over 30 Minutes Intravenous On call to O.R. 04/02/24 0531 04/02/24 0743        Patient was given sequential compression devices, early ambulation, and chemoprophylaxis to prevent DVT.  Inpatient Morphine Milligram Equivalents Per Day 10/13 - 10/14   Values displayed are in units of MME/Day    Order Start / End Date Yesterday Today    oxyCODONE (Oxy IR/ROXICODONE) immediate release tablet 5 mg 10/13 - 10/13 0 of Unknown --    oxyCODONE (ROXICODONE) 5 MG/5ML solution 5 mg 10/13 - 10/13 0 of Unknown --      Group  total: 0 of Unknown     fentaNYL (SUBLIMAZE) injection 25-50 mcg 10/13 - 10/13 0 of 45-90 --    fentaNYL (SUBLIMAZE) injection 10/13 - 10/13 *30 of 30 --    oxyCODONE (Oxy IR/ROXICODONE) immediate release tablet 5-10 mg 10/13 - No end date 0 of 22.5-45 0 of 45-90    oxyCODONE (Oxy IR/ROXICODONE) immediate release tablet 10-15 mg 10/13 - No end date 45 of 45-67.5 15 of 90-135    Daily Totals  * 75 of Unknown (at least 142.5-232.5) 15 of 135-225  *One-Step medication  Calculation Errors     Order Type Date Details   oxyCODONE (Oxy IR/ROXICODONE) immediate release tablet 5 mg Ordered Dose -- Insufficient frequency information    oxyCODONE (ROXICODONE) 5 MG/5ML solution 5 mg Ordered Dose -- Insufficient frequency information            Patient benefited maximally from hospital stay and there were no complications.    Recent vital signs: Patient Vitals for the past 24 hrs:  BP Temp Temp src Pulse Resp SpO2  04/03/24 0524 110/65 97.9 F (36.6 C) Oral 64 15 100 %  04/03/24 0152 (!) 118/58 97.7 F (36.5 C) Oral 60 17 98 %  04/02/24 2127 121/66 97.6 F (36.4 C) Oral 62 16 96 %  04/02/24 1737 (!) 144/80 -- -- 66 16 96 %  04/02/24 1400 126/60 98.2 F (36.8 C) Oral 69 19 (!) 86 %  04/02/24 1216 125/64 97.8 F (36.6 C) Oral 61 16 95 %  04/02/24 1200 (!) 122/53 -- -- (!) 59 13 91 %  04/02/24 1130 (!) 127/56 -- -- 62 16 92 %  04/02/24 1100 (!) 132/56 -- -- 61 20 92 %  04/02/24 1045 134/68 97.7 F (36.5 C) -- 60 14 99 %  04/02/24 1030 124/62 -- -- 61 13 99 %  04/02/24 1015 124/64 -- -- 61 14 100 %  04/02/24 1000 124/61 -- -- 66 15 100 %  04/02/24 0945 123/62 (!) 97 F (36.1 C) -- 71 16 100 %     Recent laboratory studies: No results for input(s): WBC, HGB, HCT, PLT, NA, K, CL, CO2, BUN, CREATININE, GLUCOSE, INR, CALCIUM in the last 72 hours.  Invalid input(s): PT, 2   Discharge Medications:   Allergies as of 04/03/2024       Reactions   Hydrocodone Shortness Of Breath   Pt states she has taken percocet with no issues and believes she has taken dilaudid but is not positive   Magnesium-containing Compounds Anaphylaxis   Has been in hospital with last 2 colon preps         Medication List     PAUSE taking these medications    aspirin EC 81 MG tablet Wait to take this until: May 01, 2024 Take 81 mg by mouth in the morning. Swallow whole. You also have another medication with the same name that you may need to continue taking.       STOP taking these medications    meloxicam 15 MG tablet Commonly known as: MOBIC       TAKE these medications     albuterol 108 (90 Base) MCG/ACT inhaler Commonly known as: VENTOLIN HFA albuterol sulfate HFA 90 mcg/actuation aerosol inhaler  TAKE 2 PUFFS BY MOUTH EVERY 4 TO 6 HOURS AS NEEDED   alendronate 70 MG tablet Commonly known as: FOSAMAX Take 70 mg by mouth every Saturday.   amLODipine  5 MG tablet Commonly known as: NORVASC  Take 5 mg  by mouth in the morning.   aspirin EC 325 MG tablet Take 1 tablet (325 mg total) by mouth 2 (two) times daily. What changed: Another medication with the same name was paused. Ask your nurse or doctor if you should take this medication.   bumetanide  1 MG tablet Commonly known as: BUMEX  Take 1 mg by mouth in the morning.   calcium carbonate 600 MG Tabs tablet Commonly known as: OS-CAL Take 1,200 mg by mouth daily.   cetirizine 10 MG tablet Commonly known as: ZYRTEC Take 10 mg by mouth as needed.   colchicine 0.6 MG tablet Take 0.6 mg by mouth in the morning.   docusate sodium 100 MG capsule Commonly known as: Colace Take 1 capsule (100 mg total) by mouth 2 (two) times daily.   DULoxetine 30 MG capsule Commonly known as: CYMBALTA Take 30 mg by mouth in the morning.   famotidine 20 MG tablet Commonly known as: PEPCID Take 20 mg by mouth 2 (two) times daily.   gemfibrozil 600 MG tablet Commonly known as: LOPID Take 300 mg by mouth in the morning and at bedtime.   losartan 25 MG tablet Commonly known as: COZAAR Take 25 mg by mouth in the morning.   metFORMIN 500 MG tablet Commonly known as: GLUCOPHAGE Take 500 mg by mouth 2 (two) times daily with a meal.   methocarbamol 500 MG tablet Commonly known as: ROBAXIN Take 500-1,000 mg by mouth 4 (four) times daily as needed for muscle spasms.   metoprolol succinate 50 MG 24 hr tablet Commonly known as: TOPROL-XL Take 50 mg by mouth in the morning.   oxyCODONE-acetaminophen 5-325 MG tablet Commonly known as: PERCOCET/ROXICET Take 1 tablet by mouth every 4 (four) hours as needed for  severe pain (pain score 7-10).   pantoprazole 20 MG tablet Commonly known as: PROTONIX Take 20 mg by mouth daily.   potassium chloride  10 MEQ tablet Commonly known as: KLOR-CON  M Take 5 mEq by mouth daily.   pravastatin 80 MG tablet Commonly known as: PRAVACHOL Take 80 mg by mouth at bedtime.   predniSONE 5 MG tablet Commonly known as: DELTASONE Take 5 mg by mouth every other day.   Symbicort 160-4.5 MCG/ACT inhaler Generic drug: budesonide-formoterol Inhale 2 puffs into the lungs 2 (two) times daily.   TYLENOL 500 MG tablet Generic drug: acetaminophen Take 500 mg by mouth daily as needed for moderate pain (pain score 4-6).   VITAMIN D PO Take 1,000 Units by mouth daily.               Durable Medical Equipment  (From admission, onward)           Start     Ordered   04/02/24 1216  DME Walker rolling  Once       Question:  Patient needs a walker to treat with the following condition  Answer:  Status post total left knee replacement   04/02/24 1215   04/02/24 1216  DME 3 n 1  Once        04/02/24 1215              Discharge Care Instructions  (From admission, onward)           Start     Ordered   04/03/24 0000  Weight bearing as tolerated        04/03/24 0910            Diagnostic Studies: DG Chest 2 View Result Date: 03/28/2024  CLINICAL DATA:  Preop chest exam.  Upcoming knee replacement. EXAM: CHEST - 2 VIEW COMPARISON:  Radiograph 02/06/2024 FINDINGS: Upper normal heart size is stable. The cardiomediastinal contours are unchanged. Mild bronchial thickening. Pulmonary vasculature is normal. No consolidation, pleural effusion, or pneumothorax. No acute osseous abnormalities are seen. IMPRESSION: Mild bronchial thickening. Electronically Signed   By: Andrea Gasman M.D.   On: 03/28/2024 18:02    Disposition: Discharge disposition: 01-Home or Self Care       Discharge Instructions     Call MD / Call 911   Complete by: As directed     If you experience chest pain or shortness of breath, CALL 911 and be transported to the hospital emergency room.  If you develope a fever above 101 F, pus (white drainage) or increased drainage or redness at the wound, or calf pain, call your surgeon's office.   Constipation Prevention   Complete by: As directed    Drink plenty of fluids.  Prune juice may be helpful.  You may use a stool softener, such as Colace (over the counter) 100 mg twice a day.  Use MiraLax (over the counter) for constipation as needed.   Diet - low sodium heart healthy   Complete by: As directed    Driving restrictions   Complete by: As directed    No driving for 2 weeks   Increase activity slowly as tolerated   Complete by: As directed    Patient may shower   Complete by: As directed    You may shower without a dressing once there is no drainage.  Do not wash over the wound.  If drainage remains, cover wound with plastic wrap and then shower.   Post-operative opioid taper instructions:   Complete by: As directed    POST-OPERATIVE OPIOID TAPER INSTRUCTIONS: It is important to wean off of your opioid medication as soon as possible. If you do not need pain medication after your surgery it is ok to stop day one. Opioids include: Codeine, Hydrocodone(Norco, Vicodin), Oxycodone(Percocet, oxycontin) and hydromorphone amongst others.  Long term and even short term use of opiods can cause: Increased pain response Dependence Constipation Depression Respiratory depression And more.  Withdrawal symptoms can include Flu like symptoms Nausea, vomiting And more Techniques to manage these symptoms Hydrate well Eat regular healthy meals Stay active Use relaxation techniques(deep breathing, meditating, yoga) Do Not substitute Alcohol to help with tapering If you have been on opioids for less than two weeks and do not have pain than it is ok to stop all together.  Plan to wean off of opioids This plan should start  within one week post op of your joint replacement. Maintain the same interval or time between taking each dose and first decrease the dose.  Cut the total daily intake of opioids by one tablet each day Next start to increase the time between doses. The last dose that should be eliminated is the evening dose.      Weight bearing as tolerated   Complete by: As directed         Follow-up Information     Yvone Rush, MD Follow up in 2 week(s).   Specialty: Orthopedic Surgery Contact information: [address] [place] [address] [phone]                  Signed: [name] 04/03/2024, 9:10 AM

## 2024-04-03 NOTE — TOC Transition Note (Signed)
 Transition of Care [facility]) - Discharge Note   Patient Details  Name: Stefanie Campbell MRN: [id number] Date of Birth: 1955/08/22  [facility] CM/SW Contact:  NORMAN ASPEN, LCSW Phone Number: 04/03/2024, 11:31 AM   Clinical Narrative:    Met with pt who confirms need for a RW and no DME agency preference. RW ordered via [employer] and item delivered to room.  OPPT already arranged with [facility].  No further IP CM needs.   Final next level of care: OP Rehab Barriers to Discharge: No Barriers Identified   Patient Goals and CMS Choice Patient states their goals for this hospitalization and ongoing recovery are:: return home          Discharge Placement                       Discharge Plan and Services Additional resources added to the After Visit Summary for                  DME Arranged: Walker rolling DME Agency: [employer] Date DME Agency Contacted: 04/03/24 Time DME Agency Contacted: 0930 Representative spoke with at DME Agency: [facility]            Social Drivers of Health (SDOH) Interventions SDOH Screenings   Food Insecurity: No Food Insecurity (04/02/2024)  Housing: Low Risk  (04/02/2024)  Transportation Needs: No Transportation Needs (04/02/2024)  Utilities: Not At Risk (04/02/2024)  Depression (PHQ2-9): Low Risk  (01/09/2024)  Social Connections: Moderately Integrated (04/02/2024)  Stress: No Stress Concern Present (11/21/2020)   Received from [facility]  Tobacco Use: Low Risk  (04/02/2024)     Readmission Risk Interventions     No data to display

## 2024-04-03 NOTE — Progress Notes (Signed)
 PATIENT ID: Stefanie Campbell  MRN: [id number]  DOB/AGE:  Dec 12, 1955 / 67 y.o.  1 Day Post-Op Procedure(s) (LRB): ARTHROPLASTY, KNEE, TOTAL (Left)    PROGRESS NOTE Subjective: Patient is alert, oriented, no Nausea, no Vomiting, yes passing gas. Taking PO well. Denies SOB, Chest or Calf Pain. Using Incentive Spirometer, PAS in place. Ambulate WBAT with pt walking 11 ft with therapy, Yesterday's total administered Morphine Milligram Equivalents: 75), Patient reports pain as 7/10 .    Objective: Vital signs in last 24 hours: Vitals:   04/02/24 1737 04/02/24 2127 04/03/24 0152 04/03/24 0524  BP: (!) 144/80 121/66 (!) 118/58 110/65  Pulse: 66 62 60 64  Resp: 16 16 17 15   Temp:  97.6 F (36.4 C) 97.7 F (36.5 C) 97.9 F (36.6 C)  TempSrc:  Oral Oral Oral  SpO2: 96% 96% 98% 100%  Weight:      Height:          Intake/Output from previous day: I/O last 3 completed shifts: In: 3807.7 [P.O.:720; I.V.:2890.5; IV Piggyback:197.2] Out: 1500 [Urine:1500]   Intake/Output this shift: No intake/output data recorded.   LABORATORY DATA: Recent Labs    04/02/24 1623 04/02/24 2129 04/03/24 0732  GLUCAP 228* 173* 129*    Examination: Neurologically intact Neurovascular intact Sensation intact distally Intact pulses distally Dorsiflexion/Plantar flexion intact Incision: dressing C/D/I and no drainage No cellulitis present Compartment soft}  Assessment:   1 Day Post-Op Procedure(s) (LRB): ARTHROPLASTY, KNEE, TOTAL (Left) ADDITIONAL DIAGNOSIS: Expected Acute Blood Loss Anemia, Cardiac Arrythmia Afib and Sleep Apnea  Patient's anticipated LOS is less than 2 midnights, meeting these requirements: - Younger than 38 - Lives within 1 hour of care - Has a competent adult at home to recover with post-op recover - NO history of  - Chronic pain requiring opiods  - Diabetes  - Coronary Artery Disease  - Heart failure  - Heart attack  - Stroke  - DVT/VTE  - Respiratory  Failure/COPD  - Renal failure  - Anemia  - Advanced Liver disease     Plan: PT/OT WBAT, AROM and PROM  DVT Prophylaxis:  SCDx72hrs, ASA 325 mg BID x 2 weeks DISCHARGE PLAN: Home DISCHARGE NEEDS: HHPT, Walker, and 3-in-1 comode seat     Stefanie Campbell 04/03/2024, 9:04 AM

## 2024-04-04 DIAGNOSIS — M1712 Unilateral primary osteoarthritis, left knee: Secondary | ICD-10-CM | POA: Diagnosis not present

## 2024-04-04 LAB — GLUCOSE, CAPILLARY
Glucose-Capillary: 134 mg/dL — ABNORMAL HIGH (ref 70–99)
Glucose-Capillary: 125 mg/dL — ABNORMAL HIGH (ref 70–99)

## 2024-04-04 NOTE — Progress Notes (Signed)
 Reviewed written d/c instructions w pt and her husband, all questions answered, they both verbalized understanding. D/C via w/c w all belongings in stable condition

## 2024-04-04 NOTE — Plan of Care (Signed)
 Problem: Health Behavior/Discharge Planning: Goal: Ability to manage health-related needs will improve 04/04/2024 1522 by Sebastian Boyer, RN Outcome: Adequate for Discharge 04/04/2024 1516 by Sebastian Boyer, RN Outcome: Progressing   Problem: Clinical Measurements: Goal: Ability to maintain clinical measurements within normal limits will improve 04/04/2024 1522 by Sebastian Boyer, RN Outcome: Adequate for Discharge 04/04/2024 1516 by Sebastian Boyer, RN Outcome: Progressing Goal: Will remain free from infection 04/04/2024 1522 by Sebastian Boyer, RN Outcome: Adequate for Discharge 04/04/2024 1516 by Sebastian Boyer, RN Outcome: Progressing Goal: Diagnostic test results will improve 04/04/2024 1522 by Sebastian Boyer, RN Outcome: Adequate for Discharge 04/04/2024 1516 by Sebastian Boyer, RN Outcome: Progressing Goal: Respiratory complications will improve 04/04/2024 1522 by Sebastian Boyer, RN Outcome: Adequate for Discharge 04/04/2024 1516 by Sebastian Boyer, RN Outcome: Progressing Goal: Cardiovascular complication will be avoided 04/04/2024 1522 by Sebastian Boyer, RN Outcome: Adequate for Discharge 04/04/2024 1516 by Sebastian Boyer, RN Outcome: Progressing   Problem: Activity: Goal: Risk for activity intolerance will decrease 04/04/2024 1522 by Sebastian Boyer, RN Outcome: Adequate for Discharge 04/04/2024 1516 by Sebastian Boyer, RN Outcome: Progressing   Problem: Nutrition: Goal: Adequate nutrition will be maintained 04/04/2024 1522 by Sebastian Boyer, RN Outcome: Adequate for Discharge 04/04/2024 1516 by Sebastian Boyer, RN Outcome: Progressing   Problem: Coping: Goal: Level of anxiety will decrease 04/04/2024 1522 by Sebastian Boyer, RN Outcome: Adequate for Discharge 04/04/2024 1516 by Sebastian Boyer, RN Outcome: Progressing   Problem: Elimination: Goal: Will not experience complications related to bowel motility 04/04/2024 1522  by Sebastian Boyer, RN Outcome: Adequate for Discharge 04/04/2024 1516 by Sebastian Boyer, RN Outcome: Progressing Goal: Will not experience complications related to urinary retention 04/04/2024 1522 by Sebastian Boyer, RN Outcome: Adequate for Discharge 04/04/2024 1516 by Sebastian Boyer, RN Outcome: Progressing   Problem: Pain Managment: Goal: General experience of comfort will improve and/or be controlled 04/04/2024 1522 by Sebastian Boyer, RN Outcome: Adequate for Discharge 04/04/2024 1516 by Sebastian Boyer, RN Outcome: Progressing   Problem: Safety: Goal: Ability to remain free from injury will improve 04/04/2024 1522 by Sebastian Boyer, RN Outcome: Adequate for Discharge 04/04/2024 1516 by Sebastian Boyer, RN Outcome: Progressing   Problem: Skin Integrity: Goal: Risk for impaired skin integrity will decrease 04/04/2024 1522 by Sebastian Boyer, RN Outcome: Adequate for Discharge 04/04/2024 1516 by Sebastian Boyer, RN Outcome: Progressing   Problem: Education: Goal: Ability to describe self-care measures that may prevent or decrease complications (Diabetes Survival Skills Education) will improve 04/04/2024 1522 by Sebastian Boyer, RN Outcome: Adequate for Discharge 04/04/2024 1516 by Sebastian Boyer, RN Outcome: Progressing Goal: Individualized Educational Video(s) 04/04/2024 1522 by Sebastian Boyer, RN Outcome: Adequate for Discharge 04/04/2024 1516 by Sebastian Boyer, RN Outcome: Progressing   Problem: Coping: Goal: Ability to adjust to condition or change in health will improve 04/04/2024 1522 by Sebastian Boyer, RN Outcome: Adequate for Discharge 04/04/2024 1516 by Sebastian Boyer, RN Outcome: Progressing   Problem: Fluid Volume: Goal: Ability to maintain a balanced intake and output will improve 04/04/2024 1522 by Sebastian Boyer, RN Outcome: Adequate for Discharge 04/04/2024 1516 by Sebastian Boyer, RN Outcome: Progressing    Problem: Health Behavior/Discharge Planning: Goal: Ability to identify and utilize available resources and services will improve 04/04/2024 1522 by Sebastian Boyer, RN Outcome: Adequate for Discharge 04/04/2024 1516 by Sebastian Boyer, RN Outcome: Progressing Goal: Ability to manage health-related needs will improve 04/04/2024 1522 by Sebastian Boyer, RN Outcome: Adequate for Discharge 04/04/2024 1516 by Sebastian Boyer, RN Outcome: Progressing   Problem: Metabolic: Goal: Ability to maintain appropriate  glucose levels will improve 04/04/2024 1522 by Sebastian Boyer, RN Outcome: Adequate for Discharge 04/04/2024 1516 by Sebastian Boyer, RN Outcome: Progressing   Problem: Nutritional: Goal: Maintenance of adequate nutrition will improve 04/04/2024 1522 by Sebastian Boyer, RN Outcome: Adequate for Discharge 04/04/2024 1516 by Sebastian Boyer, RN Outcome: Progressing Goal: Progress toward achieving an optimal weight will improve 04/04/2024 1522 by Sebastian Boyer, RN Outcome: Adequate for Discharge 04/04/2024 1516 by Sebastian Boyer, RN Outcome: Progressing   Problem: Skin Integrity: Goal: Risk for impaired skin integrity will decrease 04/04/2024 1522 by Sebastian Boyer, RN Outcome: Adequate for Discharge 04/04/2024 1516 by Sebastian Boyer, RN Outcome: Progressing   Problem: Tissue Perfusion: Goal: Adequacy of tissue perfusion will improve 04/04/2024 1522 by Sebastian Boyer, RN Outcome: Adequate for Discharge 04/04/2024 1516 by Sebastian Boyer, RN Outcome: Progressing   Problem: Education: Goal: Knowledge of the prescribed therapeutic regimen will improve 04/04/2024 1522 by Sebastian Boyer, RN Outcome: Adequate for Discharge 04/04/2024 1516 by Sebastian Boyer, RN Outcome: Progressing Goal: Individualized Educational Video(s) 04/04/2024 1522 by Sebastian Boyer, RN Outcome: Adequate for Discharge 04/04/2024 1516 by Sebastian Boyer, RN Outcome:  Progressing   Problem: Activity: Goal: Ability to avoid complications of mobility impairment will improve 04/04/2024 1522 by Sebastian Boyer, RN Outcome: Adequate for Discharge 04/04/2024 1516 by Sebastian Boyer, RN Outcome: Progressing Goal: Range of joint motion will improve 04/04/2024 1522 by Sebastian Boyer, RN Outcome: Adequate for Discharge 04/04/2024 1516 by Sebastian Boyer, RN Outcome: Progressing   Problem: Clinical Measurements: Goal: Postoperative complications will be avoided or minimized 04/04/2024 1522 by Sebastian Boyer, RN Outcome: Adequate for Discharge 04/04/2024 1516 by Sebastian Boyer, RN Outcome: Progressing   Problem: Pain Management: Goal: Pain level will decrease with appropriate interventions 04/04/2024 1522 by Sebastian Boyer, RN Outcome: Adequate for Discharge 04/04/2024 1516 by Sebastian Boyer, RN Outcome: Progressing   Problem: Skin Integrity: Goal: Will show signs of wound healing 04/04/2024 1522 by Sebastian Boyer, RN Outcome: Adequate for Discharge 04/04/2024 1516 by Sebastian Boyer, RN Outcome: Progressing

## 2024-04-04 NOTE — Progress Notes (Addendum)
 Physical Therapy Treatment Patient Details Name: Stefanie Campbell MRN: [id number] DOB: [birth year] Today's Date: 04/04/2024   History of Present Illness 68 yo female s/p R TKA  on 04/02/24. PMH: Anemia, CKD, obesity, HTN, liver dz, LE edema, PAF, venous insufficiency, Graves dz, syncope, DVT    PT Comments  Pt is making limited progress with PT, continues to complain of dizziness/ wooziness chest fullness and generally not feeling well.  Orthostatics were negative as below.  Again, progress with PT is limited by medical issues. RN updated. Continue PT POC   04/04/24 0900  Vital Signs  BP Location Left Arm  BP Method Automatic  Orthostatic Lying   BP- Lying 159/76  Pulse- Lying 91  Orthostatic Sitting  BP- Sitting 167/88  Pulse- Sitting 104  Orthostatic Standing at 0 minutes  BP- Standing at 0 minutes (!) 181/92  Pulse- Standing at 0 minutes 106     If plan is discharge home, recommend the following: A little help with walking and/or transfers;A little help with bathing/dressing/bathroom;Help with stairs or ramp for entrance;Assist for transportation;Assistance with cooking/housework   Can travel by private [employer] (2 wheels)    Recommendations for Other Services       Precautions / Restrictions Precautions Precautions: Fall;Knee Recall of Precautions/Restrictions: Intact Restrictions Weight Bearing Restrictions Per Provider Order: No Other Position/Activity Restrictions: WBAT     Mobility  Bed Mobility Overal bed mobility: Needs Assistance Bed Mobility: Supine to Sit     Supine to sit: Min assist     General bed mobility comments: incr time and effort, self assisting LLE with gait belt however ultimately required min assist to complete transition    Transfers Overall transfer level: Needs assistance Equipment used: Rolling walker (2 wheels) Transfers: Sit to/from Stand Sit to Stand: Contact guard  assist, Min assist, From elevated surface           General transfer comment: cues for hand placement and RLE positon, assist to rise and transition to RW    Ambulation/Gait Ambulation/Gait assistance: Min assist, +2 safety/equipment Gait Distance (Feet): 6 Feet Assistive device: Rolling walker (2 wheels) Gait Pattern/deviations: Step-to pattern Gait velocity: decr     General Gait Details: pt complained of wooziness, with cues able to progress a few more steps  prior to seated rest. pt reports fatigue and chest fullness after seated in chair   Stairs             Wheelchair Mobility     Tilt Bed    Modified Rankin (Stroke Patients Only)       Balance   Sitting-balance support: No upper extremity supported, Feet supported Sitting balance-Leahy Scale: Fair     Standing balance support: During functional activity, Reliant on assistive device for balance, Bilateral upper extremity supported Standing balance-Leahy Scale: Poor                              Communication Communication Communication: No apparent difficulties  Cognition Arousal: Alert Behavior During Therapy: WFL for tasks assessed/performed   PT - Cognitive impairments: No apparent impairments                         Following commands: Intact      Cueing Cueing Techniques: Verbal cues  Exercises Total Joint Exercises Ankle Circles/Pumps: AROM, Both, 5 reps    General  Comments        Pertinent Vitals/Pain Pain Assessment Pain Assessment: Faces Faces Pain Scale: Hurts even more Pain Location: L knee Pain Descriptors / Indicators: Aching, Discomfort, Grimacing, Guarding Pain Intervention(s): Limited activity within patient's tolerance, Monitored during session, Premedicated before session, Repositioned, Ice applied    Home Living                          Prior Function            PT Goals (current goals can now be found in the care plan  section) Acute Rehab PT Goals PT Goal Formulation: With patient Time For Goal Achievement: 04/09/24 Potential to Achieve Goals: Good Progress towards PT goals: Progressing toward goals    Frequency    7X/week      PT Plan      Co-evaluation              AM-PAC PT 6 Clicks Mobility   Outcome Measure  Help needed turning from your back to your side while in a flat bed without using bedrails?: A Little Help needed moving from lying on your back to sitting on the side of a flat bed without using bedrails?: A Little Help needed moving to and from a bed to a chair (including a wheelchair)?: A Little Help needed standing up from a chair using your arms (e.g., wheelchair or bedside chair)?: A Little Help needed to walk in hospital room?: A Little Help needed climbing 3-5 steps with a railing? : Total 6 Click Score: 16    End of Session Equipment Utilized During Treatment: Gait belt Activity Tolerance: Treatment limited secondary to medical complications (Comment) Patient left: in chair;with call bell/phone within reach;with chair alarm set Nurse Communication: Mobility status;Other (comment) (BPs, medical issues) PT Visit Diagnosis: Other abnormalities of gait and mobility (R26.89);Difficulty in walking, not elsewhere classified (R26.2)     Time: 8950-8891 PT Time Calculation (min) (ACUTE ONLY): 19 min  Charges:                            Rexene, PT  Acute Rehab Dept [facility]) [phone]  04/04/2024    [facility] 04/04/2024, 11:14 AM

## 2024-04-04 NOTE — Plan of Care (Signed)
   Problem: Clinical Measurements: Goal: Diagnostic test results will improve Outcome: Progressing

## 2024-04-04 NOTE — Progress Notes (Signed)
 PATIENT ID: Stefanie Campbell  MRN: [id number]  DOB/AGE:  08/01/1955 / 68 y.o.  2 Days Post-Op Procedure(s) (LRB): ARTHROPLASTY, KNEE, TOTAL (Left)    PROGRESS NOTE Subjective: Patient is alert, oriented, no Nausea, no Vomiting, yes passing gas. Taking PO well. Denies SOB, Chest or Calf Pain. Using Incentive Spirometer, PAS in place. Ambulate WBAT with pt up with therapy , Yesterday's total administered Morphine Milligram Equivalents: 45), Patient reports pain as 7/10 .    Objective: Vital signs in last 24 hours: Vitals:   04/03/24 0934 04/03/24 1323 04/03/24 2002 04/04/24 0445  BP: (!) 108/56 122/61 110/61 (!) 142/66  Pulse: (!) 58 69 71 89  Resp: 12 18 15 16   Temp:  97.6 F (36.4 C) 97.9 F (36.6 C) 98.5 F (36.9 C)  TempSrc:  Oral    SpO2: 94% 95% 95% 94%  Weight:      Height:          Intake/Output from previous day: I/O last 3 completed shifts: In: 4016.5 [P.O.:960; I.V.:3056.5] Out: 800 [Urine:800]   Intake/Output this shift: No intake/output data recorded.   LABORATORY DATA: Recent Labs    04/03/24 1655 04/03/24 2129 04/04/24 0746  GLUCAP 160* 187* 134*    Examination: Neurologically intact Neurovascular intact Sensation intact distally Intact pulses distally Dorsiflexion/Plantar flexion intact Incision: dressing C/D/I and no drainage No cellulitis present Compartment soft}  Assessment:   2 Days Post-Op Procedure(s) (LRB): ARTHROPLASTY, KNEE, TOTAL (Left) ADDITIONAL DIAGNOSIS: Expected Acute Blood Loss Anemia, Anemia, CKD, obesity, HTN, liver dz, LE edema, PAF, venous insufficiency, Graves dz, syncope, DVT  Anticipated LOS equal to or greater than 2 midnights due to - Age 68 and older with one or more of the following:  - Obesity  - Expected need for hospital services (PT, OT, Nursing) required for safe  discharge  - Anticipated need for postoperative skilled nursing care or inpatient rehab   OR   - Unanticipated findings during/Post  Surgery: Slow post-op progression: GI, pain control, mobility     Plan: PT/OT WBAT, AROM and PROM  DVT Prophylaxis:  SCDx72hrs, ASA 325 mg BID x 2 weeks DISCHARGE PLAN: Home DISCHARGE NEEDS: HHPT, Walker, and 3-in-1 comode seat     Stefanie Campbell 04/04/2024, 8:23 AM  Wb

## 2024-04-04 NOTE — Progress Notes (Signed)
 PT TX NOTE  04/04/24 1600  PT Visit Information  Last PT Received On 04/04/24  Assistance Needed Good progress this pm, pt meeting PT goals. Spouse present for session and able to assist with mobility/stairs during session. Pt is ready to d/c with family assist as needed from PT standpoint  History of Present Illness 68 yo female s/p R TKA  on 04/02/24. PMH: Anemia, CKD, obesity, HTN, liver dz, LE edema, PAF, venous insufficiency, Graves dz, syncope, DVT  Precautions  Precautions Fall;Knee  Recall of Precautions/Restrictions Intact  Restrictions  Weight Bearing Restrictions Per Provider Order No  Other Position/Activity Restrictions WBAT  Pain Assessment  Pain Assessment Faces  Faces Pain Scale 4  Pain Location L knee  Pain Descriptors / Indicators Aching;Discomfort;Grimacing;Guarding  Pain Intervention(s) Limited activity within patient's tolerance;Monitored during session;Premedicated before session;Repositioned  Cognition  Arousal Alert  Behavior During Therapy WFL for tasks assessed/performed  PT - Cognitive impairments No apparent impairments  Following Commands  Following commands Intact  Cueing  Cueing Techniques Verbal cues  Communication  Communication No apparent difficulties  Transfers  Overall transfer level Needs assistance  Equipment used Rolling walker (2 wheels)  Transfers Sit to/from Stand;Bed to chair/wheelchair/BSC  Sit to Stand Contact guard assist  Bed to/from chair/wheelchair/BSC transfer type: Step pivot  Step pivot transfers Contact guard assist  General transfer comment cues for hand placement and RLE positon, CGA to rise and transition to RW  Ambulation/Gait  Ambulation/Gait assistance Contact guard assist;Min assist  Gait Distance (Feet) 26 Feet  Assistive device Rolling walker (2 wheels)  Gait Pattern/deviations Step-to pattern  General Gait Details cues for sequence, RW position. no c/o dizziness this pm. CGA for safety, no overt LOB  Gait  velocity decr  Stairs Yes  Stairs assistance Min assist  Stair Management Step to pattern;Forwards;With walker;No rails  Number of Stairs 2 (up2 /down 3)  General stair comments cues for sequence and technique. assist to balance and manage RW  Balance  Sitting-balance support No upper extremity supported;Feet supported  Sitting balance-Leahy Scale Fair  Standing balance support During functional activity;Reliant on assistive device for balance;Bilateral upper extremity supported  Standing balance-Leahy Scale Poor  PT - End of Session  Equipment Utilized During Treatment Gait belt  Activity Tolerance Treatment limited secondary to medical complications (Comment)  Patient left in chair;with call bell/phone within reach;with chair alarm set  Nurse Communication Mobility status;Other (comment) (BPs, medical issues)   PT - Assessment/Plan  PT Visit Diagnosis Other abnormalities of gait and mobility (R26.89);Difficulty in walking, not elsewhere classified (R26.2)  PT Frequency (ACUTE ONLY) 7X/week  Follow Up Recommendations Follow physician's recommendations for discharge plan and follow up therapies  Patient can return home with the following A little help with walking and/or transfers;A little help with bathing/dressing/bathroom;Help with stairs or ramp for entrance;Assist for transportation;Assistance with cooking/housework  PT equipment Rolling walker (2 wheels)  AM-PAC PT 6 Clicks Mobility Outcome Measure (Version 2)  Help needed turning from your back to your side while in a flat bed without using bedrails? 3  Help needed moving from lying on your back to sitting on the side of a flat bed without using bedrails? 3  Help needed moving to and from a bed to a chair (including a wheelchair)? 3  Help needed standing up from a chair using your arms (e.g., wheelchair or bedside chair)? 3  Help needed to walk in hospital room? 3  Help needed climbing 3-5 steps with a railing?  1  6  Click  Score 16  Consider Recommendation of Discharge To: Home with [facility] ([facility])  PT Goal Progression  Progress towards PT goals Progressing toward goals  Acute Rehab PT Goals  PT Goal Formulation With patient  Time For Goal Achievement 04/09/24  Potential to Achieve Goals Good  PT Time Calculation  PT Start Time (ACUTE ONLY) 1450  PT Stop Time (ACUTE ONLY) 1520  PT Time Calculation (min) (ACUTE ONLY) 30 min  PT General Charges  $$ ACUTE PT VISIT 1 Visit  PT Treatments  $Gait Training 23-37 mins

## 2024-04-12 ENCOUNTER — Ambulatory Visit (HOSPITAL_COMMUNITY)
Admission: RE | Admit: 2024-04-12 | Discharge: 2024-04-12 | Disposition: A | Discharge status: Home / Self Care | Source: Ambulatory Visit | Attending: Vascular Surgery | Admitting: Vascular Surgery

## 2024-04-12 ENCOUNTER — Other Ambulatory Visit (HOSPITAL_COMMUNITY): Payer: Self-pay | Admitting: Orthopedic Surgery

## 2024-04-12 DIAGNOSIS — M79662 Pain in left lower leg: Secondary | ICD-10-CM | POA: Insufficient documentation

## 2024-04-12 DIAGNOSIS — M7989 Other specified soft tissue disorders: Secondary | ICD-10-CM | POA: Diagnosis present

## 2024-04-30 NOTE — Progress Notes (Unsigned)
 [facility]  [address] [place],  [place]  [address] [phone]  Clinic Day:  12/30/2023  Referring physician: Marelyn Quill, MD  HISTORY OF PRESENT ILLNESS:  The patient is a 68 y.o. female with anemia secondary to previous iron  deficiency, as well as chronic renal insufficiency.  She comes in today to reassess her anemia.  Since her last visit, the patient has been doing okay.  She has occasional fatigue, but denies having any overt forms of blood loss which concern her for progressive anemia.    PHYSICAL EXAM:  There were no vitals taken for this visit. Wt Readings from Last 3 Encounters:  04/02/24 213 lb (96.6 kg)  03/28/24 213 lb (96.6 kg)  01/20/24 229 lb 12.8 oz (104.2 kg)   There is no height or weight on file to calculate BMI. Performance status (ECOG): 1 - Symptomatic but completely ambulatory Physical Exam Constitutional:      Appearance: Normal appearance. She is not ill-appearing.  HENT:     Mouth/Throat:     Mouth: Mucous membranes are moist.     Pharynx: Oropharynx is clear. No oropharyngeal exudate or posterior oropharyngeal erythema.  Cardiovascular:     Rate and Rhythm: Normal rate and regular rhythm.     Heart sounds: No murmur heard.    No friction rub. No gallop.  Pulmonary:     Effort: Pulmonary effort is normal. No respiratory distress.     Breath sounds: Normal breath sounds. No wheezing, rhonchi or rales.  Abdominal:     General: Bowel sounds are normal. There is no distension.     Palpations: Abdomen is soft. There is no mass.     Tenderness: There is no abdominal tenderness.  Musculoskeletal:        General: No swelling.     Right lower leg: No edema.     Left lower leg: No edema.  Lymphadenopathy:     Cervical: No cervical adenopathy.     Upper Body:     Right upper body: No supraclavicular or axillary adenopathy.     Left upper body: No supraclavicular or axillary adenopathy.     Lower Body: No  right inguinal adenopathy. No left inguinal adenopathy.  Skin:    General: Skin is warm.     Coloration: Skin is not jaundiced.     Findings: No lesion or rash.  Neurological:     General: No focal deficit present.     Mental Status: She is alert and oriented to person, place, and time. Mental status is at baseline.  Psychiatric:        Mood and Affect: Mood normal.        Behavior: Behavior normal.        Thought Content: Thought content normal.    LABS:      Latest Ref Rng & Units 03/30/2024    9:36 AM 03/05/2024    9:40 AM 02/07/2024    9:37 AM  CBC  WBC 4.0 - 10.5 K/uL 6.7  5.1  5.9   Hemoglobin 12.0 - 15.0 g/dL 9.9  9.3  9.7   Hematocrit 36.0 - 46.0 % 33.0  31.0  32.4   Platelets 150 - 400 K/uL 391  305  324       Latest Ref Rng & Units 12/30/2023    9:02 AM 06/30/2023    1:10 PM 12/27/2022    1:24 PM  CMP  Glucose 70 - 99 mg/dL 877  79  111   BUN 8 - 23 mg/dL 19  30  21    Creatinine 0.44 - 1.00 mg/dL 8.95  8.92  8.87   Sodium 135 - 145 mmol/L 141  143  140   Potassium 3.5 - 5.1 mmol/L 3.8  3.8  4.2   Chloride 98 - 111 mmol/L 103  103  104   CO2 22 - 32 mmol/L 27  27  27    Calcium 8.9 - 10.3 mg/dL 89.5  89.5  89.8   Total Protein 6.5 - 8.1 g/dL 7.0  7.0  7.1   Total Bilirubin 0.0 - 1.2 mg/dL 0.3  0.3  0.4   Alkaline Phos 38 - 126 U/L 88  112  51   AST 15 - 41 U/L 22  29  24    ALT 0 - 44 U/L 19  51  39     Latest Reference Range & Units 12/30/23 09:02  Iron  28 - 170 ug/dL 67  UIBC ug/dL 705  TIBC 749 - 549 ug/dL 638  Saturation Ratios 10.4 - 31.8 % 19  Ferritin 11 - 307 ng/mL 343 (H)  (H): Data is abnormally high  ASSESSMENT & PLAN:  A 68 y.o. female with anemia secondary to previous iron  deficiency and chronic renal insufficiency.  Her hemoglobin today is now slightly below 10.  Her iron  parameters today show no evidence of iron  deficiency anemia being present.  As her hemoglobin is below 10, I did give her the option of taking red cell shot therapy once every  1-2 months.  She will call back to let us  know if she wishes to do this or follow her anemia conservatively over these next few months.  Otherwise, I will see her back in 4 months for repeat clinical assessment.  The patient understands all the plans discussed today and is in agreement with them.  Kushi Kun DELENA Kerns, MD

## 2024-05-01 ENCOUNTER — Inpatient Hospital Stay: Attending: Oncology | Admitting: Oncology

## 2024-05-01 ENCOUNTER — Telehealth: Payer: Self-pay

## 2024-05-01 ENCOUNTER — Inpatient Hospital Stay

## 2024-05-01 ENCOUNTER — Other Ambulatory Visit: Payer: Self-pay | Admitting: Oncology

## 2024-05-01 ENCOUNTER — Telehealth: Payer: Self-pay | Admitting: Oncology

## 2024-05-01 VITALS — BP 144/78 | HR 74 | Temp 98.4°F | Resp 14 | Ht 63.0 in | Wt 216.8 lb

## 2024-05-01 DIAGNOSIS — D631 Anemia in chronic kidney disease: Secondary | ICD-10-CM | POA: Diagnosis not present

## 2024-05-01 DIAGNOSIS — D509 Iron deficiency anemia, unspecified: Secondary | ICD-10-CM | POA: Insufficient documentation

## 2024-05-01 DIAGNOSIS — N189 Chronic kidney disease, unspecified: Secondary | ICD-10-CM | POA: Diagnosis not present

## 2024-05-01 DIAGNOSIS — D508 Other iron deficiency anemias: Secondary | ICD-10-CM

## 2024-05-01 LAB — CMP (CANCER CENTER ONLY)
ALT: 17 U/L (ref 0–44)
AST: 19 U/L (ref 15–41)
Albumin: 4.2 g/dL (ref 3.5–5.0)
Alkaline Phosphatase: 107 U/L (ref 38–126)
Anion gap: 9 (ref 5–15)
BUN: 15 mg/dL (ref 8–23)
CO2: 28 mmol/L (ref 22–32)
Calcium: 9.7 mg/dL (ref 8.9–10.3)
Chloride: 104 mmol/L (ref 98–111)
Creatinine: 0.83 mg/dL (ref 0.44–1.00)
GFR, Estimated: >60 mL/min (ref >60)
Glucose, Bld: 88 mg/dL (ref 70–99)
Potassium: 3.8 mmol/L (ref 3.5–5.1)
Sodium: 141 mmol/L (ref 135–145)
Total Bilirubin: 0.3 mg/dL (ref 0.0–1.2)
Total Protein: 6.8 g/dL (ref 6.5–8.1)

## 2024-05-01 LAB — CBC WITH DIFFERENTIAL (CANCER CENTER ONLY)
Abs Immature Granulocytes: 0.01 K/uL (ref 0.00–0.07)
Basophils Absolute: 0.1 K/uL (ref 0.0–0.1)
Basophils Relative: 1 %
Eosinophils Absolute: 0.4 K/uL (ref 0.0–0.5)
Eosinophils Relative: 6 %
HCT: 29.6 % — ABNORMAL LOW (ref 36.0–46.0)
Hemoglobin: 8.7 g/dL — ABNORMAL LOW (ref 12.0–15.0)
Immature Granulocytes: 0 %
Lymphocytes Relative: 47 %
Lymphs Abs: 3 K/uL (ref 0.7–4.0)
MCH: 26.2 pg (ref 26.0–34.0)
MCHC: 29.4 g/dL — ABNORMAL LOW (ref 30.0–36.0)
MCV: 89.2 fL (ref 80.0–100.0)
Monocytes Absolute: 0.7 K/uL (ref 0.1–1.0)
Monocytes Relative: 10 %
Neutro Abs: 2.3 K/uL (ref 1.7–7.7)
Neutrophils Relative %: 36 %
Platelet Count: 371 K/uL (ref 150–400)
RBC: 3.32 MIL/uL — ABNORMAL LOW (ref 3.87–5.11)
RDW: 16.3 % — ABNORMAL HIGH (ref 11.5–15.5)
WBC Count: 6.3 K/uL (ref 4.0–10.5)
nRBC: 0 % (ref 0.0–0.2)

## 2024-05-01 LAB — IRON AND TIBC
Iron: 67 ug/dL (ref 28–170)
Saturation Ratios: 23 % (ref 10.4–31.8)
TIBC: 291 ug/dL (ref 250–450)
UIBC: 225 ug/dL

## 2024-05-01 LAB — FERRITIN: Ferritin: 783 ng/mL — ABNORMAL HIGH (ref 11–307)

## 2024-05-01 NOTE — Telephone Encounter (Signed)
 Patient has been scheduled for follow-up visit per 04/30/24 LOS.  Pt aware of scheduled appt details.

## 2024-05-01 NOTE — Telephone Encounter (Signed)
  Latest Reference Range & Units 05/01/24 09:17   Iron  28 - 170 ug/dL 67  UIBC ug/dL 774  TIBC 749 - 549 ug/dL 708  Saturation Ratios 10.4 - 31.8 % 23  Ferritin 11 - 307 ng/mL 783 (H)  (H): Data is abnormally high   ASSESSMENT & PLAN:  A 68 y.o. female with anemia secondary to previous iron  deficiency and chronic renal insufficiency.  Her hemoglobin today is lower than what it has been previously.  Ironically, this patient's renal function has improved to where it is essentially normal today.  Her iron  parameters today are not consistent with her having iron  deficiency anemia.  Based upon this, Retacrit  therapy 20000 units will be given monthly, with a goal to get her hemoglobin to/above 10 monthly.  I will see her back in 3 months for repeat clinical assessment.  If her hemoglobin remains significantly below 10 despite intervention, the patient understands a bone marrow biopsy would be considered to ensure an intrinsic marrow disorder is not present.  The patient understands all the plans discussed today and is in agreement with them.   Dequincy DELENA Kerns, MD

## 2024-05-29 ENCOUNTER — Inpatient Hospital Stay: Attending: Oncology

## 2024-05-29 ENCOUNTER — Inpatient Hospital Stay

## 2024-05-29 VITALS — BP 139/76 | HR 78 | Temp 98.6°F | Resp 16

## 2024-05-29 DIAGNOSIS — N183 Chronic kidney disease, stage 3 unspecified: Secondary | ICD-10-CM | POA: Insufficient documentation

## 2024-05-29 DIAGNOSIS — D631 Anemia in chronic kidney disease: Secondary | ICD-10-CM | POA: Diagnosis not present

## 2024-05-29 LAB — CBC WITH DIFFERENTIAL (CANCER CENTER ONLY)
Abs Immature Granulocytes: 0.01 K/uL (ref 0.00–0.07)
Basophils Absolute: 0 K/uL (ref 0.0–0.1)
Basophils Relative: 1 %
Eosinophils Absolute: 0.4 K/uL (ref 0.0–0.5)
Eosinophils Relative: 7 %
HCT: 30.5 % — ABNORMAL LOW (ref 36.0–46.0)
Hemoglobin: 8.9 g/dL — ABNORMAL LOW (ref 12.0–15.0)
Immature Granulocytes: 0 %
Lymphocytes Relative: 28 %
Lymphs Abs: 1.7 K/uL (ref 0.7–4.0)
MCH: 26.1 pg (ref 26.0–34.0)
MCHC: 29.2 g/dL — ABNORMAL LOW (ref 30.0–36.0)
MCV: 89.4 fL (ref 80.0–100.0)
Monocytes Absolute: 0.6 K/uL (ref 0.1–1.0)
Monocytes Relative: 11 %
Neutro Abs: 3.2 K/uL (ref 1.7–7.7)
Neutrophils Relative %: 53 %
Platelet Count: 349 K/uL (ref 150–400)
RBC: 3.41 MIL/uL — ABNORMAL LOW (ref 3.87–5.11)
RDW: 15 % (ref 11.5–15.5)
WBC Count: 6.1 K/uL (ref 4.0–10.5)
nRBC: 0 % (ref 0.0–0.2)

## 2024-05-29 MED ORDER — EPOETIN ALFA-EPBX 20000 UNIT/ML IJ SOLN
20000.0000 [IU] | Freq: Once | INTRAMUSCULAR | Status: AC
  Administered 2024-05-29: 20000 [IU] via SUBCUTANEOUS
  Filled 2024-05-29: qty 1

## 2024-05-29 NOTE — Patient Instructions (Signed)

## 2024-06-22 NOTE — Progress Notes (Signed)
 Assessment 1. GCA (giant cell arteritis) (HCC)  Comprehensive Metabolic Panel   C-Reactive Protein (CRP)   Sedimentation Rate (ESR)   CBC with Differential    2. Drug therapy  Comprehensive Metabolic Panel   C-Reactive Protein (CRP)   Sedimentation Rate (ESR)   CBC with Differential    3. PMR (polymyalgia rheumatica) (CMD)  Comprehensive Metabolic Panel   C-Reactive Protein (CRP)   Sedimentation Rate (ESR)   CBC with Differential   sarilumab (Kevzara) 200 mg/1.14 mL pnij subcutaneous injection      Plan/Orders [employer], apply for Kevzara.  Patient with a clinical diagnosis of GCA made by PCP more than 5 years ago. Her biopsy at that time was negative. She was referred here already being on prednisone . At this time, difficult to say whether she had GCA but risk or stroke and/or blindness if this was in fact GCA.  Continue prednisone  5 mg every other day.  Continue fosamax, calcium, vit D. Return in about 6 months (around 12/20/2024).  Chief Complaint Stefanie Campbell is here for follow up GCA  HPI Follow up headache, GCA, therapy monitoring. On prednisone  5 mg every other day.  New insurance so did not get Kevzara.  S/p TKA 03/2024.    Seen back in July 2022 for worsening headaches and inflammatory markers were elevated compared to baseline. We sent her for temporal artery biopsy but it was not done, deemed clinically irrelevant and recommendation to resume Actemra and prednisone .  Already had a right sided temporal biopsy more than 5 years ago that was negative for TA.  Currently on prednisone  5 mg every day. Was on free medication with IV Actemra but no longer qualifies. She was approved for home auto injector but co pay too high. No change.  She also saw neurology for headaches, MRI and CTA unremarkable, small vessel disease.   Pt has been taking Fosamax 70 mg weekly,  Also on calcium and vit D. No cancer history. She tolerates therapy well.  Seeing nephrology for  CKD. No new or worsening headaches. No eye or jaw pains.  Medical history of lumbar DDD, HTN, IDA, HLD, DM, GERD.  Vital Signs BP 133/83 (BP Location: Left arm, Patient Position: Sitting)   Pulse 91   Ht 1.626 m (5' 4)   Wt 97.3 kg (214 lb 9.6 oz)   SpO2 97%   BMI 36.84 kg/m  Physical Exam Pt is alert and oriented x 3. Answers questions appropriately.  Heart: normal S1, S2. No S3, no murmur, no thrills. No JVD. Lungs: no dyspnea observed, normal breath sounds on auscultation.  Extremities: no cyanosis, no edema, no calves tenderness. Skin: warm, dry. No scalp or jaw tenderness. Strength 5/5 UE/LE  PMH Medical History[1]  PSH Surgical History[2]  Allergies Magnesium, Hydrocodone, Molnupiravir, and Gabapentin  Medications Medications Ordered Prior to Encounter[3]   Family Hx Family History[4]  Social Hx  Social History[5]       [1] Past Medical History: Diagnosis Date   Hypertension    Type 2 diabetes mellitus without complication, without long-term current use of insulin     (CMD) 10/09/2021  [2] Past Surgical History: Procedure Laterality Date   BREAST SURGERY Left    Procedure: BREAST SURGERY; LUMPECTOMY   CESAREAN SECTION, UNSPECIFIED  X2   Procedure: CESAREAN SECTION   CHEILECTOMY Left 11/09/2017   Procedure: CHEILECTOMY (FOOT);  Surgeon: Elspeth Fairy Proud, DPM;  Location: [facility];  Service: Podiatry;  Laterality: Left;   CORRECTION HAMMER TOE Bilateral 11/09/2017  Procedure: HAMMER TOE CORRECTION;  Surgeon: Elspeth Fairy Proud, DPM;  Location: [facility];  Service: Podiatry;  Laterality: Bilateral;  5th toe right foot and 5th toe left foot   FOOT SURGERY     Procedure: FOOT SURGERY   KNEE ARTHROSCOPY Left    Procedure: KNEE ARTHROSCOPY  [3] Current Outpatient Medications on File Prior to Visit  Medication Sig Dispense Refill   albuterol  HFA (PROVENTIL  HFA;VENTOLIN  HFA;PROAIR  HFA) 90 mcg/actuation inhaler albuterol   sulfate HFA 90 mcg/actuation aerosol inhaler     alendronate (FOSAMAX) 70 mg tablet TAKE 1 TABLET  ONE TIME PER WEEK 12 tablet 0   amLODIPine  (NORVASC ) 2.5 mg tablet TAKE 1 TABLET EVERY DAY BY ORAL ROUTE FOR 90 DAYS, FOR BLOOD PRESSURE. (Patient taking differently: Take 5 mg by mouth daily.)     aspirin  81 mg EC tablet Take  by mouth Once Daily.     budesonide-formoteroL (SYMBICORT) 160-4.5 mcg/actuation inhaler Symbicort 160 mcg-4.5 mcg/actuation HFA aerosol inhaler     bumetanide  (BUMEX ) 1 mg tablet TAKE 1 TABLET EVERY DAY BY ORAL ROUTE FOR 90 DAYS, FOR HYPERTENSION.     calcium acetate 668 mg (169 mg calcium) tab Take 1,200 mg by mouth Once Daily.     calcium carbonate (OS-CAL) 1500 mg (600 mg calcium) tablet Take  by mouth.     cetirizine (ZyrTEC) 10 mg tablet Take 10 mg by mouth.     cholecalciferol (VITAMIN D3) 1,000 unit (25 mcg) tablet Take 3,000 mg by mouth Once Daily.     colchicine  0.6 mg tablet Take one tablet daily 90 tablet 1   DULoxetine  (CYMBALTA ) 30 mg capsule Take 30 mg by mouth daily.     famotidine  (PEPCID ) 20 mg tablet Take 20 mg by mouth 2 (two) times a day.     finasteride (PROSCAR) 5 mg tablet      fluticasone furoate-vilanteroL (BREO ELLIPTA) 100-25 mcg/dose dsdv inhaler TAKE 1 INHALATION ONCE A DAY     gemfibroziL  (LOPID ) 600 mg tablet      Klor-Con  M10 10 mEq ER tablet Take 10 mEq by mouth daily.     losartan  (COZAAR ) 25 mg tablet Take 1 tablet by mouth daily.     meloxicam (MOBIC) 15 mg tablet      metFORMIN  (GLUCOPHAGE ) 500 mg tablet      methocarbamoL  (ROBAXIN ) 750 mg tablet TAKE 1 TABLET 3 TIMES A DAY BY ORAL ROUTE AS NEEDED. 90 tablet 1   metoprolol  succinate (TOPROL  XL) 50 mg 24 hr tablet      ondansetron  (ZOFRAN -ODT) 4 mg disintegrating tablet      pantoprazole  (PROTONIX ) 20 mg EC tablet Take 1 tablet by mouth Once Daily.     pravastatin (PRAVACHOL) 80 mg tablet Take 1 tablet by mouth nightly.     predniSONE  (DELTASONE ) 5 mg tablet  TAKE 1 TABLET BY MOUTH EVERY OTHER DAY 45 tablet 1   [DISCONTINUED] sarilumab (Kevzara) 200 mg/1.14 mL pnij subcutaneous injection Inject 1.14 mL (200 mg total) under the skin every 14 (fourteen) days. 7.98 mL 0   No current facility-administered medications on file prior to visit.  [4] Family History Problem Relation Name Age of Onset   Cancer Father     Cancer Sister     Heart disease Sister     Diabetes Sister     Psoriasis Sister     Cancer Brother     Rheum arthritis Neg Hx     Lupus Neg Hx     Ankylosing spondylitis Neg  Hx     Gout Neg Hx    [5] Social History Tobacco Use   Smoking status: Never   Smokeless tobacco: Never  Vaping Use   Vaping status: Never Used  Substance Use Topics   Alcohol use: No   Drug use: No

## 2024-06-22 NOTE — Telephone Encounter (Signed)
 Duplicate request

## 2024-06-22 NOTE — Telephone Encounter (Signed)
 New insurance. Need PA for Kevzara.

## 2024-06-26 ENCOUNTER — Encounter: Payer: Self-pay | Admitting: Oncology

## 2024-06-26 ENCOUNTER — Inpatient Hospital Stay: Payer: Medicare (Managed Care) | Attending: Oncology

## 2024-06-26 ENCOUNTER — Inpatient Hospital Stay: Attending: Oncology

## 2024-06-26 VITALS — BP 148/81 | HR 86 | Temp 98.2°F | Resp 18

## 2024-06-26 DIAGNOSIS — D631 Anemia in chronic kidney disease: Secondary | ICD-10-CM | POA: Diagnosis not present

## 2024-06-26 DIAGNOSIS — N183 Chronic kidney disease, stage 3 unspecified: Secondary | ICD-10-CM | POA: Insufficient documentation

## 2024-06-26 MED ORDER — EPOETIN ALFA-EPBX 20000 UNIT/ML IJ SOLN
20000.0000 [IU] | Freq: Once | INTRAMUSCULAR | Status: AC
  Administered 2024-06-26: 20000 [IU] via SUBCUTANEOUS
  Filled 2024-06-26: qty 1

## 2024-06-26 NOTE — Patient Instructions (Signed)

## 2024-06-27 ENCOUNTER — Encounter: Payer: Self-pay | Admitting: Oncology

## 2024-07-24 ENCOUNTER — Inpatient Hospital Stay: Payer: Medicare (Managed Care) | Admitting: Oncology

## 2024-07-24 ENCOUNTER — Inpatient Hospital Stay: Payer: Medicare (Managed Care)

## 2024-07-31 ENCOUNTER — Inpatient Hospital Stay: Payer: Medicare (Managed Care) | Admitting: Oncology

## 2024-07-31 ENCOUNTER — Inpatient Hospital Stay: Payer: Medicare (Managed Care)

## 2024-07-31 ENCOUNTER — Inpatient Hospital Stay: Payer: Medicare (Managed Care) | Attending: Oncology
